# Patient Record
Sex: Female | Born: 1955 | Race: White | Hispanic: No | Marital: Married | Smoking: Former smoker
Health system: Southern US, Community
[De-identification: ages and names within clinical notes are randomized; demographics above are authoritative.]

## PROBLEM LIST (undated history)

## (undated) DIAGNOSIS — R002 Palpitations: Secondary | ICD-10-CM

## (undated) DIAGNOSIS — I471 Supraventricular tachycardia, unspecified: Secondary | ICD-10-CM

## (undated) DIAGNOSIS — M4800 Spinal stenosis, site unspecified: Secondary | ICD-10-CM

## (undated) DIAGNOSIS — I4891 Unspecified atrial fibrillation: Secondary | ICD-10-CM

## (undated) DIAGNOSIS — R42 Dizziness and giddiness: Secondary | ICD-10-CM

## (undated) DIAGNOSIS — I6529 Occlusion and stenosis of unspecified carotid artery: Secondary | ICD-10-CM

## (undated) DIAGNOSIS — T8859XA Other complications of anesthesia, initial encounter: Secondary | ICD-10-CM

## (undated) DIAGNOSIS — T8853XA Unintended awareness under general anesthesia during procedure, initial encounter: Secondary | ICD-10-CM

## (undated) DIAGNOSIS — R112 Nausea with vomiting, unspecified: Secondary | ICD-10-CM

## (undated) DIAGNOSIS — M545 Low back pain, unspecified: Secondary | ICD-10-CM

## (undated) DIAGNOSIS — M1990 Unspecified osteoarthritis, unspecified site: Secondary | ICD-10-CM

## (undated) DIAGNOSIS — F40240 Claustrophobia: Secondary | ICD-10-CM

## (undated) DIAGNOSIS — I739 Peripheral vascular disease, unspecified: Secondary | ICD-10-CM

## (undated) DIAGNOSIS — G6181 Chronic inflammatory demyelinating polyneuritis: Secondary | ICD-10-CM

## (undated) DIAGNOSIS — G629 Polyneuropathy, unspecified: Secondary | ICD-10-CM

## (undated) DIAGNOSIS — E871 Hypo-osmolality and hyponatremia: Secondary | ICD-10-CM

## (undated) DIAGNOSIS — R531 Weakness: Secondary | ICD-10-CM

## (undated) DIAGNOSIS — R519 Headache, unspecified: Secondary | ICD-10-CM

## (undated) DIAGNOSIS — M719 Bursopathy, unspecified: Secondary | ICD-10-CM

## (undated) DIAGNOSIS — K449 Diaphragmatic hernia without obstruction or gangrene: Secondary | ICD-10-CM

## (undated) DIAGNOSIS — I2510 Atherosclerotic heart disease of native coronary artery without angina pectoris: Secondary | ICD-10-CM

## (undated) DIAGNOSIS — Z9889 Other specified postprocedural states: Secondary | ICD-10-CM

## (undated) DIAGNOSIS — R200 Anesthesia of skin: Secondary | ICD-10-CM

## (undated) DIAGNOSIS — R2689 Other abnormalities of gait and mobility: Secondary | ICD-10-CM

## (undated) DIAGNOSIS — I472 Ventricular tachycardia, unspecified: Secondary | ICD-10-CM

## (undated) DIAGNOSIS — R52 Pain, unspecified: Secondary | ICD-10-CM

## (undated) DIAGNOSIS — G8929 Other chronic pain: Secondary | ICD-10-CM

## (undated) DIAGNOSIS — I4892 Unspecified atrial flutter: Secondary | ICD-10-CM

## (undated) DIAGNOSIS — I10 Essential (primary) hypertension: Secondary | ICD-10-CM

## (undated) DIAGNOSIS — H9190 Unspecified hearing loss, unspecified ear: Secondary | ICD-10-CM

## (undated) DIAGNOSIS — R413 Other amnesia: Secondary | ICD-10-CM

## (undated) DIAGNOSIS — R1310 Dysphagia, unspecified: Secondary | ICD-10-CM

## (undated) DIAGNOSIS — K219 Gastro-esophageal reflux disease without esophagitis: Secondary | ICD-10-CM

## (undated) DIAGNOSIS — R202 Paresthesia of skin: Secondary | ICD-10-CM

## (undated) DIAGNOSIS — E7800 Pure hypercholesterolemia, unspecified: Secondary | ICD-10-CM

## (undated) DIAGNOSIS — M8580 Other specified disorders of bone density and structure, unspecified site: Secondary | ICD-10-CM

## (undated) HISTORY — PX: ABDOMINAL SURGERY: SHX537

## (undated) HISTORY — PX: CHOLECYSTECTOMY: SHX55

## (undated) HISTORY — PX: TUBAL LIGATION: SHX77

## (undated) HISTORY — PX: CAROTID ENDARTERECTOMY: SUR193

## (undated) HISTORY — PX: TONSILLECTOMY: SUR1361

## (undated) HISTORY — PX: ABDOMINAL HYSTERECTOMY: SHX81

## (undated) HISTORY — DX: Claustrophobia: F40.240

## (undated) HISTORY — DX: Supraventricular tachycardia, unspecified: I47.10

## (undated) HISTORY — DX: Dizziness and giddiness: R42

## (undated) HISTORY — DX: Hypo-osmolality and hyponatremia: E87.1

## (undated) HISTORY — DX: Other complications of anesthesia, initial encounter: T88.59XA

## (undated) HISTORY — DX: Paresthesia of skin: R20.2

## (undated) HISTORY — DX: Supraventricular tachycardia: I47.1

## (undated) HISTORY — DX: Headache, unspecified: R51.9

## (undated) HISTORY — PX: EPIDURAL STEROID INJECTION: SHX510998

## (undated) HISTORY — DX: Occlusion and stenosis of unspecified carotid artery: I65.29

## (undated) HISTORY — DX: Ventricular tachycardia, unspecified: I47.20

## (undated) HISTORY — DX: Other amnesia: R41.3

## (undated) HISTORY — DX: Unintended awareness under general anesthesia during procedure, initial encounter: T88.53XA

## (undated) HISTORY — PX: HYSTERECTOMY: SHX81

## (undated) HISTORY — DX: Peripheral vascular disease, unspecified: I73.9

## (undated) HISTORY — DX: Anesthesia of skin: R20.0

## (undated) HISTORY — DX: Essential (primary) hypertension: I10

## (undated) HISTORY — DX: Pain, unspecified: R52

## (undated) HISTORY — DX: Other abnormalities of gait and mobility: R26.89

## (undated) HISTORY — DX: Unspecified atrial fibrillation: I48.91

## (undated) HISTORY — PX: CARDIAC CATHETERIZATION: SHX172

## (undated) HISTORY — PX: COLONOSCOPY, DIAGNOSTIC (SCREENING): SHX174

## (undated) HISTORY — DX: Weakness: R53.1

## (undated) HISTORY — DX: Polyneuropathy, unspecified: G62.9

## (undated) HISTORY — DX: Unspecified atrial flutter: I48.92

## (undated) HISTORY — DX: Atherosclerotic heart disease of native coronary artery without angina pectoris: I25.10

## (undated) HISTORY — DX: Unspecified hearing loss, unspecified ear: H91.90

## (undated) HISTORY — DX: Pure hypercholesterolemia, unspecified: E78.00

---

## 2016-05-16 ENCOUNTER — Ambulatory Visit (INDEPENDENT_AMBULATORY_CARE_PROVIDER_SITE_OTHER): Payer: BLUE CROSS/BLUE SHIELD | Admitting: Specialist

## 2016-05-16 ENCOUNTER — Encounter (INDEPENDENT_AMBULATORY_CARE_PROVIDER_SITE_OTHER): Payer: Self-pay | Admitting: Specialist

## 2016-05-16 DIAGNOSIS — I6521 Occlusion and stenosis of right carotid artery: Secondary | ICD-10-CM

## 2016-05-16 HISTORY — DX: Occlusion and stenosis of right carotid artery: I65.21

## 2016-05-16 NOTE — Progress Notes (Signed)
[name]    Chief Complaint   Patient presents with   . Advice Only     consult possible blockage in Right carotid artery          History of Present Illness     Audrey Gilmore is a 61 y.o. female who presents With asymptomatic right carotid artery stenosis.  Patient states that a few years back, had a neurological event and the workup was essentially unremarkable.  However, the presentation was vertigo.  Recently has undergone carotid duplex ultrasound examination which suggested significant right internal carotid artery stenosis.  An MRA was performed which suggested a greater than 90 percent stenosis of the right internal carotid artery.    Past Medical History     Past Medical History:   Diagnosis Date   . Carotid stenosis, asymptomatic, right 05/16/2016       Past Surgical History     History reviewed. No pertinent surgical history.    Family History     History reviewed. No pertinent family history.    Social History     Social History     Social History   . Marital status: Married     Spouse name: N/A   . Number of children: N/A   . Years of education: N/A     Occupational History   . Not on file.     Social History Main Topics   . Smoking status: Former Games developer   . Smokeless tobacco: Former Neurosurgeon   . Alcohol use No   . Drug use: No   . Sexual activity: Not on file     Other Topics Concern   . Not on file     Social History Narrative   . No narrative on file       Allergies     Allergies   Allergen Reactions   . Cephalosporins    . Ciprofloxacin    . Dye [Iodinated Diagnostic Agents]    . Levaquin [Levofloxacin]    . Lidocaine        Medications     No current outpatient prescriptions on file prior to visit.     No current facility-administered medications on file prior to visit.        Review of Systems     Constitutional: Negative for fevers and chills  Skin: No rash or lesions  Respiratory: Negative for cough, wheezing, or hemoptysis  Cardiovascular: as per HPI  Gastrointestinal: Negative for  abdominal pain, nausea, vomiting and diarrhea  Musculoskeletal:  No arthritic symptoms  Genitourinary: Negative for dysuria  All other systems were reviewed and are negative except what is stated in the HPI      Physical Exam     Vitals:    05/16/16 1359   BP: 134/84   Pulse: 83       Body mass index is 26.76 kg/m.    General: Patient appears their stated age, well-nourished. Alert and in no apparent distress.  Lungs: Respiratory effort unlabored, chest expansion symmetric.  Cardiac: RRR, Right carotid bruits, no JVD.   Extremities warm, Right Femoral pulses 2+, Left Femoral 2+,  Abd: Soft, nondistended, nontender. No guarding or rebound, No mid line pulsatile mass   ZOX:WRUE ROM in all 4 extremities, symmetric   Skin: Color appropriate for race, Skin warm, dry, no gangrene, no non healing ulcers, no varicose veins , no hyperpigmentation, no lipo-dermatosclerosis  Neuro: Good insight and judgment, oriented to person, place, and time CN II-XII  intact, gross motor and sensory intact    Labs     CBC: No results found for: WBC, RBC, HGB, HCT, MCV, MCHC, RDW, PLT    CMP: No results found for: NA, K, CL, CO2, GLU, BUN, CREATININE, PROT, BILITOT, ALKPHOS, AST, ALT, ANIONGAP    Lipid Panel No results found for: CHOL, TRIG, HDL    Coags: No results found for: PT, INR, PTT      Diagnostic Imaging     I have reviewed and interpreted the vascular diagnostic imaging with the patient    Assessment and Plan       1. Carotid stenosis, asymptomatic, right         My impression is that she has greater than 90 percent asymptomatic right internal carotid artery, which was documented with a duplex ultrasound as well as a MRA.  The end-diastolic velocities were greater than 100 cm/s.  The MRA was reviewed in detail, which suggests 90-99 percent stenosis.  Therefore, because of her relatively young age, I have recommended to her to undergo right carotid artery endarterectomy.  She was also given the option of possible conservative  therapy with maximal medical therapy which she declined.  Therefore, we will proceed with right carotid artery endarterectomy under local anesthesia.    The risks and benefits of the procedures were explained to the patient and agreed. The risk of infection, bleeding, nerve injury, TIA, stroke, thrombosis and other complications were explained in detail and agreed.     Dr. Karsten Ro would like to thank you for allowing me to participate in care of this patient.    With regards    [name], MD, FACS, RPVI  [facility]  Chairman, Dept of Surgery, [facility]

## 2016-05-18 ENCOUNTER — Encounter (INDEPENDENT_AMBULATORY_CARE_PROVIDER_SITE_OTHER): Payer: Self-pay | Admitting: Specialist

## 2016-05-30 ENCOUNTER — Ambulatory Visit: Payer: BLUE CROSS/BLUE SHIELD | Attending: Specialist

## 2016-05-30 NOTE — Pre-Procedure Instructions (Signed)
Faxed cardio for LOV/EKG  Faxed PCP for LOV/labs

## 2016-05-31 ENCOUNTER — Encounter (INDEPENDENT_AMBULATORY_CARE_PROVIDER_SITE_OTHER): Payer: Self-pay

## 2016-06-05 NOTE — Pre-Procedure Instructions (Signed)
Refaxed PCP for LOV/recent labs

## 2016-06-06 ENCOUNTER — Telehealth (INDEPENDENT_AMBULATORY_CARE_PROVIDER_SITE_OTHER): Payer: Self-pay | Admitting: Family

## 2016-06-06 NOTE — Telephone Encounter (Signed)
Patient is pending a right carotid endarterectomy with Dr. Lucianne Muss, on January 29.    The procedure is scheduled for Audrey Gilmore at 7 AM    Medications: The patient will continue aspirin and tramadol    Patient was educated on postoperative instructions.

## 2016-06-07 NOTE — Pre-Procedure Instructions (Signed)
[facility] left VM stating they were unable to read the fax that was received requesting something.  Office called back at [phone]. It appears based on prev Note in Epic that the request is for labs and LOV. This information was requested verbally to the caller.

## 2016-06-08 NOTE — Anesthesia Preprocedure Evaluation (Addendum)
Anesthesia Evaluation    AIRWAY    Mallampati: II    TM distance: >3 FB  Neck ROM: full  Mouth Opening:full   CARDIOVASCULAR    cardiovascular exam normal       DENTAL    no notable dental hx     PULMONARY    pulmonary exam normal     OTHER FINDINGS    Pt id and h&P reviewed.          Relevant Problems   No active problems are marked relevant to this note.       PSS Anesthesia Comments:   PONV, PSVT,HH,GERD, Chronic Pain, Dysphagia; Labs(03/22/2016)- WNL; EKG- SR; Echo(04/2016)- EF 65 %, Trace MR;Carotid Doppler(04/2016)- Rt.ICA, proximal- 80-99 % occlusion, Left ICA- < 50 % occlusion; Cardiac LOV(05/2016)- Dr. Karsten Ro.LSullivan          Anesthesia Plan    ASA 3     peripheral nerve block               (Anesthesia plan explained. Risks and benefits explained.Patient agrees with plan.)      intravenous induction   Detailed anesthesia plan: general IV and PNB  Monitors/Adjuncts: other    Post Op: other  Post op pain management: per surgeon    informed consent obtained    Plan discussed with CRNA.               Signed by: Helaine Chess 06/08/16 3:00 PM

## 2016-06-11 ENCOUNTER — Inpatient Hospital Stay
Admission: RE | Admit: 2016-06-11 | Discharge: 2016-06-12 | DRG: 039 | Disposition: A | Discharge status: Home / Self Care | Payer: BLUE CROSS/BLUE SHIELD | Source: Ambulatory Visit | Attending: Specialist | Admitting: Specialist

## 2016-06-11 ENCOUNTER — Inpatient Hospital Stay: Payer: BLUE CROSS/BLUE SHIELD | Admitting: Specialist

## 2016-06-11 ENCOUNTER — Inpatient Hospital Stay: Payer: BLUE CROSS/BLUE SHIELD | Admitting: Anesthesiology

## 2016-06-11 ENCOUNTER — Encounter
Admission: RE | Disposition: A | Discharge status: Home / Self Care | Payer: Self-pay | Source: Ambulatory Visit | Attending: Specialist

## 2016-06-11 DIAGNOSIS — Z87891 Personal history of nicotine dependence: Secondary | ICD-10-CM

## 2016-06-11 DIAGNOSIS — K219 Gastro-esophageal reflux disease without esophagitis: Secondary | ICD-10-CM | POA: Diagnosis present

## 2016-06-11 DIAGNOSIS — I6521 Occlusion and stenosis of right carotid artery: Principal | ICD-10-CM | POA: Diagnosis present

## 2016-06-11 DIAGNOSIS — Z7982 Long term (current) use of aspirin: Secondary | ICD-10-CM

## 2016-06-11 HISTORY — DX: Other specified disorders of bone density and structure, unspecified site: M85.80

## 2016-06-11 HISTORY — DX: Unspecified osteoarthritis, unspecified site: M19.90

## 2016-06-11 HISTORY — DX: Gastro-esophageal reflux disease without esophagitis: K21.9

## 2016-06-11 HISTORY — DX: Low back pain, unspecified: M54.50

## 2016-06-11 HISTORY — DX: Nausea with vomiting, unspecified: R11.2

## 2016-06-11 HISTORY — PX: ENDARTERECTOMY, CAROTID ARTERY: SHX3831

## 2016-06-11 HISTORY — DX: Diaphragmatic hernia without obstruction or gangrene: K44.9

## 2016-06-11 HISTORY — DX: Other specified postprocedural states: Z98.890

## 2016-06-11 HISTORY — DX: Other chronic pain: G89.29

## 2016-06-11 HISTORY — DX: Supraventricular tachycardia, unspecified: I47.10

## 2016-06-11 HISTORY — DX: Dysphagia, unspecified: R13.10

## 2016-06-11 HISTORY — DX: Bursopathy, unspecified: M71.9

## 2016-06-11 HISTORY — DX: Palpitations: R00.2

## 2016-06-11 LAB — TYPE AND SCREEN
AB Screen Gel: NEGATIVE
ABO Rh: O POS

## 2016-06-11 SURGERY — ENDARTERECTOMY, CAROTID ARTERY
Anesthesia: Regional | Site: Neck | Laterality: Right | Wound class: Clean

## 2016-06-11 MED ORDER — LORAZEPAM 1 MG PO TABS
1.0000 mg | ORAL_TABLET | ORAL | Status: DC
Start: 2016-06-12 — End: 2016-06-12

## 2016-06-11 MED ORDER — LIDOCAINE HCL 1 % IJ SOLN
INTRAMUSCULAR | Status: DC | PRN
Start: 2016-06-11 — End: 2016-06-11
  Administered 2016-06-11: 5 mL

## 2016-06-11 MED ORDER — LORAZEPAM 0.5 MG PO TABS
0.5000 mg | ORAL_TABLET | Freq: Two times a day (BID) | ORAL | Status: DC | PRN
Start: 2016-06-11 — End: 2016-06-11
  Filled 2016-06-11: qty 1

## 2016-06-11 MED ORDER — HYDROMORPHONE HCL 1 MG/ML IJ SOLN
0.5000 mg | INTRAMUSCULAR | Status: DC | PRN
Start: 2016-06-11 — End: 2016-06-11

## 2016-06-11 MED ORDER — ACETAMINOPHEN 325 MG PO TABS
650.0000 mg | ORAL_TABLET | ORAL | Status: DC | PRN
Start: 2016-06-11 — End: 2016-06-12
  Administered 2016-06-11 – 2016-06-12 (×4): 650 mg via ORAL
  Filled 2016-06-11 (×5): qty 2

## 2016-06-11 MED ORDER — CEFAZOLIN SODIUM 1 G IJ SOLR
INTRAMUSCULAR | Status: DC | PRN
Start: 2016-06-11 — End: 2016-06-11
  Administered 2016-06-11: 2 g via INTRAVENOUS

## 2016-06-11 MED ORDER — PROTAMINE SULFATE 10 MG/ML IV SOLN
INTRAVENOUS | Status: DC | PRN
Start: 2016-06-11 — End: 2016-06-11
  Administered 2016-06-11 (×2): 10 mg via INTRAVENOUS

## 2016-06-11 MED ORDER — ONDANSETRON HCL 4 MG/2ML IJ SOLN
INTRAMUSCULAR | Status: AC
Start: 2016-06-11 — End: ?
  Filled 2016-06-11: qty 2

## 2016-06-11 MED ORDER — ONDANSETRON HCL 4 MG/2ML IJ SOLN
4.0000 mg | Freq: Once | INTRAMUSCULAR | Status: AC | PRN
Start: 2016-06-11 — End: 2016-06-11

## 2016-06-11 MED ORDER — FENTANYL CITRATE (PF) 50 MCG/ML IJ SOLN (WRAP)
INTRAMUSCULAR | Status: AC
Start: 2016-06-11 — End: 2016-06-11
  Administered 2016-06-11: 25 ug via INTRAVENOUS
  Filled 2016-06-11: qty 2

## 2016-06-11 MED ORDER — HEPARIN SODIUM (PORCINE) 5000 UNIT/ML IJ SOLN
INTRAMUSCULAR | Status: AC
Start: 2016-06-11 — End: ?
  Filled 2016-06-11: qty 1

## 2016-06-11 MED ORDER — BUPIVACAINE HCL (PF) 0.5 % IJ SOLN
INTRAMUSCULAR | Status: DC | PRN
Start: 2016-06-11 — End: 2016-06-11
  Administered 2016-06-11 (×2): 5 mL via EPIDURAL

## 2016-06-11 MED ORDER — LIDOCAINE-EPINEPHRINE 2 %-1:200000 IJ SOLN
INTRAMUSCULAR | Status: DC | PRN
Start: 2016-06-11 — End: 2016-06-11
  Administered 2016-06-11 (×2): 5 mL

## 2016-06-11 MED ORDER — PROPOFOL 10 MG/ML IV EMUL (WRAP)
INTRAVENOUS | Status: AC
Start: 2016-06-11 — End: ?
  Filled 2016-06-11: qty 20

## 2016-06-11 MED ORDER — LIDOCAINE HCL (PF) 1 % IJ SOLN
INTRAMUSCULAR | Status: AC
Start: 2016-06-11 — End: ?
  Filled 2016-06-11: qty 30

## 2016-06-11 MED ORDER — LORAZEPAM 1 MG PO TABS
1.0000 mg | ORAL_TABLET | Freq: Once | ORAL | Status: AC
Start: 2016-06-11 — End: 2016-06-11
  Administered 2016-06-11: 1 mg via ORAL

## 2016-06-11 MED ORDER — LORAZEPAM 1 MG PO TABS
1.0000 mg | ORAL_TABLET | Freq: Two times a day (BID) | ORAL | Status: DC | PRN
Start: 2016-06-11 — End: 2016-06-11
  Administered 2016-06-11: 1 mg via ORAL
  Filled 2016-06-11: qty 1

## 2016-06-11 MED ORDER — ONDANSETRON HCL 4 MG/2ML IJ SOLN
INTRAMUSCULAR | Status: DC | PRN
Start: 2016-06-11 — End: 2016-06-11
  Administered 2016-06-11: 4 mg via INTRAVENOUS

## 2016-06-11 MED ORDER — HEPARIN SODIUM (PORCINE) 1000 UNIT/ML IJ SOLN
INTRAMUSCULAR | Status: AC
Start: 2016-06-11 — End: ?
  Filled 2016-06-11: qty 10

## 2016-06-11 MED ORDER — MEPERIDINE HCL 25 MG/ML IJ SOLN
12.5000 mg | INTRAMUSCULAR | Status: DC | PRN
Start: 2016-06-11 — End: 2016-06-11

## 2016-06-11 MED ORDER — FENTANYL CITRATE (PF) 50 MCG/ML IJ SOLN (WRAP)
25.0000 ug | INTRAMUSCULAR | Status: DC | PRN
Start: 2016-06-11 — End: 2016-06-11

## 2016-06-11 MED ORDER — TRAMADOL HCL 50 MG PO TABS
50.0000 mg | ORAL_TABLET | Freq: Four times a day (QID) | ORAL | Status: DC | PRN
Start: 2016-06-11 — End: 2016-06-11
  Administered 2016-06-11: 50 mg via ORAL
  Filled 2016-06-11: qty 1

## 2016-06-11 MED ORDER — MIDAZOLAM HCL 2 MG/2ML IJ SOLN
INTRAMUSCULAR | Status: AC
Start: 2016-06-11 — End: ?
  Filled 2016-06-11: qty 2

## 2016-06-11 MED ORDER — THROMBIN 5000 UNITS EX SOLR
CUTANEOUS | Status: AC
Start: 2016-06-11 — End: ?
  Filled 2016-06-11: qty 5000

## 2016-06-11 MED ORDER — THROMBIN 5000 UNITS EX SOLR
CUTANEOUS | Status: DC | PRN
Start: 2016-06-11 — End: 2016-06-11
  Administered 2016-06-11: 10000 [IU] via TOPICAL

## 2016-06-11 MED ORDER — HEPARIN SODIUM (PORCINE) 1000 UNIT/ML IJ SOLN
INTRAMUSCULAR | Status: AC
Start: 2016-06-11 — End: ?
  Filled 2016-06-11: qty 1

## 2016-06-11 MED ORDER — DEXAMETHASONE SODIUM PHOSPHATE 4 MG/ML IJ SOLN (WRAP)
INTRAMUSCULAR | Status: DC | PRN
Start: 2016-06-11 — End: 2016-06-11
  Administered 2016-06-11: 8 mg via INTRAVENOUS

## 2016-06-11 MED ORDER — HYDROCODONE-ACETAMINOPHEN 5-325 MG PO TABS
1.0000 | ORAL_TABLET | Freq: Once | ORAL | Status: DC | PRN
Start: 2016-06-11 — End: 2016-06-11

## 2016-06-11 MED ORDER — PROTAMINE SULFATE 10 MG/ML IV SOLN
INTRAVENOUS | Status: AC
Start: 2016-06-11 — End: ?
  Filled 2016-06-11: qty 5

## 2016-06-11 MED ORDER — LORAZEPAM 0.5 MG PO TABS
0.5000 mg | ORAL_TABLET | Freq: Two times a day (BID) | ORAL | Status: DC | PRN
Start: 2016-06-11 — End: 2016-06-11

## 2016-06-11 MED ORDER — ESMOLOL HCL 100 MG/10ML IV SOLN
INTRAVENOUS | Status: AC
Start: 2016-06-11 — End: ?
  Filled 2016-06-11: qty 10

## 2016-06-11 MED ORDER — CEFAZOLIN SODIUM 1 G IJ SOLR
INTRAMUSCULAR | Status: AC
Start: 2016-06-11 — End: ?
  Filled 2016-06-11: qty 2000

## 2016-06-11 MED ORDER — TRAMADOL HCL 50 MG PO TABS
100.0000 mg | ORAL_TABLET | Freq: Two times a day (BID) | ORAL | Status: DC | PRN
Start: 2016-06-11 — End: 2016-06-12

## 2016-06-11 MED ORDER — PROPOFOL INFUSION 10 MG/ML
INTRAVENOUS | Status: DC | PRN
Start: 2016-06-11 — End: 2016-06-11
  Administered 2016-06-11: 50 ug/kg/min via INTRAVENOUS

## 2016-06-11 MED ORDER — TRAMADOL HCL 50 MG PO TABS
50.0000 mg | ORAL_TABLET | Freq: Once | ORAL | Status: AC
Start: 2016-06-11 — End: 2016-06-11
  Administered 2016-06-11: 50 mg via ORAL
  Filled 2016-06-11: qty 1

## 2016-06-11 MED ORDER — SODIUM CHLORIDE 0.9 % IV SOLN
INTRAVENOUS | Status: DC
Start: 2016-06-11 — End: 2016-06-12

## 2016-06-11 MED ORDER — MIDAZOLAM HCL 2 MG/2ML IJ SOLN
INTRAMUSCULAR | Status: DC | PRN
Start: 2016-06-11 — End: 2016-06-11
  Administered 2016-06-11 (×2): .5 mg via INTRAVENOUS
  Administered 2016-06-11: 1 mg via INTRAVENOUS
  Administered 2016-06-11 (×2): 2 mg via INTRAVENOUS

## 2016-06-11 MED ORDER — ASPIRIN 81 MG PO TBEC
81.0000 mg | DELAYED_RELEASE_TABLET | ORAL | Status: DC
Start: 2016-06-11 — End: 2016-06-12
  Administered 2016-06-11: 81 mg via ORAL
  Filled 2016-06-11: qty 1

## 2016-06-11 MED ORDER — FENTANYL CITRATE (PF) 50 MCG/ML IJ SOLN (WRAP)
INTRAMUSCULAR | Status: DC | PRN
Start: 2016-06-11 — End: 2016-06-11
  Administered 2016-06-11 (×2): 25 ug via INTRAVENOUS
  Administered 2016-06-11: 100 ug via INTRAVENOUS
  Administered 2016-06-11 (×2): 25 ug via INTRAVENOUS

## 2016-06-11 MED ORDER — GELATIN ABSORBABLE 12-7 MM EX MISC
CUTANEOUS | Status: DC | PRN
Start: 2016-06-11 — End: 2016-06-11
  Administered 2016-06-11: 2 via TOPICAL

## 2016-06-11 MED ORDER — DEXAMETHASONE SODIUM PHOSPHATE 4 MG/ML IJ SOLN
INTRAMUSCULAR | Status: AC
Start: 2016-06-11 — End: ?
  Filled 2016-06-11: qty 2

## 2016-06-11 MED ORDER — SODIUM CHLORIDE 0.9 % IR SOLN
Status: DC | PRN
Start: 2016-06-11 — End: 2016-06-11
  Administered 2016-06-11: 1000 mL

## 2016-06-11 MED ORDER — HYDRALAZINE HCL 20 MG/ML IJ SOLN
5.0000 mg | INTRAMUSCULAR | Status: DC | PRN
Start: 2016-06-11 — End: 2016-06-11

## 2016-06-11 MED ORDER — SODIUM CHLORIDE 0.9 % IV SOLN
INTRAVENOUS | Status: DC | PRN
Start: 2016-06-11 — End: 2016-06-11

## 2016-06-11 MED ORDER — ONDANSETRON 4 MG PO TBDP
4.0000 mg | ORAL_TABLET | Freq: Three times a day (TID) | ORAL | Status: DC | PRN
Start: 2016-06-11 — End: 2016-06-12
  Administered 2016-06-11: 4 mg via ORAL
  Filled 2016-06-11: qty 1

## 2016-06-11 MED ORDER — OXYCODONE-ACETAMINOPHEN 5-325 MG PO TABS
1.0000 | ORAL_TABLET | ORAL | Status: DC | PRN
Start: 2016-06-11 — End: 2016-06-12

## 2016-06-11 MED ORDER — LORAZEPAM 1 MG PO TABS
1.0000 mg | ORAL_TABLET | ORAL | Status: DC
Start: 2016-06-11 — End: 2016-06-11
  Filled 2016-06-11: qty 1

## 2016-06-11 MED ORDER — LORAZEPAM 0.5 MG PO TABS
0.5000 mg | ORAL_TABLET | Freq: Every evening | ORAL | Status: DC
Start: 2016-06-11 — End: 2016-06-11

## 2016-06-11 MED ORDER — PROMETHAZINE HCL 25 MG/ML IJ SOLN
6.2500 mg | Freq: Once | INTRAMUSCULAR | Status: DC | PRN
Start: 2016-06-11 — End: 2016-06-11

## 2016-06-11 MED ORDER — BUPIVACAINE HCL (PF) 0.25 % IJ SOLN
INTRAMUSCULAR | Status: AC
Start: 2016-06-11 — End: ?
  Filled 2016-06-11: qty 30

## 2016-06-11 MED ORDER — FENTANYL CITRATE (PF) 50 MCG/ML IJ SOLN (WRAP)
INTRAMUSCULAR | Status: AC
Start: 2016-06-11 — End: ?
  Filled 2016-06-11: qty 2

## 2016-06-11 MED ORDER — ASPIRIN 81 MG PO TBEC
81.0000 mg | DELAYED_RELEASE_TABLET | Freq: Every day | ORAL | Status: DC
Start: 2016-06-11 — End: 2016-06-11
  Filled 2016-06-11: qty 1

## 2016-06-11 MED ORDER — HYDROMORPHONE HCL 0.5 MG/0.5 ML IJ SOLN
INTRAMUSCULAR | Status: AC
Start: 2016-06-11 — End: 2016-06-11
  Administered 2016-06-11: 0.5 mg via INTRAVENOUS
  Filled 2016-06-11: qty 0.5

## 2016-06-11 MED ORDER — SUCCINYLCHOLINE CHLORIDE 20 MG/ML IJ SOLN
INTRAMUSCULAR | Status: AC
Start: 2016-06-11 — End: ?
  Filled 2016-06-11: qty 10

## 2016-06-11 MED ORDER — LACTATED RINGERS IV SOLN
INTRAVENOUS | Status: DC
Start: 2016-06-11 — End: 2016-06-12

## 2016-06-11 MED ORDER — ESMOLOL HCL 100 MG/10ML IV SOLN
INTRAVENOUS | Status: DC | PRN
Start: 2016-06-11 — End: 2016-06-11
  Administered 2016-06-11: 10 mg via INTRAVENOUS

## 2016-06-11 MED ORDER — LORAZEPAM 0.5 MG PO TABS
0.5000 mg | ORAL_TABLET | Freq: Every evening | ORAL | Status: DC | PRN
Start: 2016-06-11 — End: 2016-06-12
  Administered 2016-06-11: 0.5 mg via ORAL
  Filled 2016-06-11: qty 1

## 2016-06-11 MED ORDER — ONDANSETRON HCL 4 MG/2ML IJ SOLN
INTRAMUSCULAR | Status: AC
Start: 2016-06-11 — End: 2016-06-11
  Administered 2016-06-11: 4 mg via INTRAVENOUS
  Filled 2016-06-11: qty 2

## 2016-06-11 SURGICAL SUPPLY — 55 items
CANISTER SUCTION 1500 CC LINE (Suction) ×1
CANISTER SUCTION 1500 CC SEMIRIGID 1 (Suction) ×1
CANISTER SUCTION 1500 CC SEMIRIGID 1 ELBOW FILTER SELF ALIGN LID (Suction) ×1 IMPLANT
CATH DOVER URETHRAL 16F 16IN (Catheter Urine) ×1
CATH FOGARTY EMBO 2FX60CM TUBE (Catheter) ×2 IMPLANT
CATHETER URETHRAL OD16 FR 2 STAGGER DRAINAGE EYE ROUND CLOSED TIP (Catheter Urine) ×1 IMPLANT
CATHETER URTH PVC DOV RBNL 16FR 16IN LF (Catheter Urine) ×1
CHLORAPREP W/TINT+APLICTR 10.5 (Prep) ×2 IMPLANT
CLIP INTERNAL L3.2 MM MEDIUM LIGATE OPEN (Clips) ×1
CLIP INTERNAL L3.2 MM MEDIUM LIGATE OPEN LIGACLIPÂ® TITANIUM SILVER (Clips) ×1 IMPLANT
CLOSURE STERI-STRIP 1X5IN (Dressing) ×2 IMPLANT
COVER PROBE SURGI L96 IN X W6-3 IN (Drape) ×1
COVER PROBE SURGI L96 IN X W6-3 IN ACCORDION FOLD INTRAOPERATIVE (Drape) ×1 IMPLANT
COVER ULTRASND STRLE 6"X3X96 (Drape) ×1
DISSECTOR KITTNER (Sponge) ×2 IMPLANT
DRAPE DEVON MAG INST 16X20IN (Drape) ×1
DRAPE INCISE IOBAN-2 60X35CM (Drape) ×2 IMPLANT
DRAPE MAGNETIC HAND FREE TRANSFER (Drape) ×1 IMPLANT
GLOVE SURG LTX SZ 8 STRL (Glove) ×2 IMPLANT
HANDLE LIGHT ADAPTIVE LIGHT CONTROL PLUS (Other) ×1
HANDLE LIGHT ADAPTIVE LIGHT CONTROL PLUS TECHNOLOGY SNAP ON LENS TOUCH (Other) ×1 IMPLANT
HANDLE TRUMPF LIGHT  DISP (Other) ×1
KIT INFECTION CONTROL CUSTOM (Kits) ×2
KIT INFECTION CONTROL CUSTOM IFOH03 (Kits) ×1 IMPLANT
LIGACLIP MED (Clips) ×1
PACK AV ~~LOC~~ (Pack) ×2 IMPLANT
PAD ELECTROSRG GRND REM W CRD (Procedure Accessories) ×2 IMPLANT
PATCH VASCULAR L8 CM X W.8 CM THK.45 MM TAPER BIOLOGIC XENOSURE BOVINE (Patch) ×1 IMPLANT
PATCH XENOSURE 0.8X8CM (Patch) ×2 IMPLANT
SET BLOOD COLLECTION OD23 GA L12 IN LOK (Needles) ×1
SET BLOOD COLLECTION OD23 GA L12 IN LOK TUBE NO LUER ADAPTER BUTTERFLY (Needles) ×1 IMPLANT
SET SFTY COLLCT BLD 23GX.75IN (Needles) ×1
SHUNT CAROTID OD4 MM ODSEC5 MM SUNDT SILICONE L30 CM CAROTID ARTERY (Shunt) IMPLANT
SHUNT NEUROSURGICAL L30 CM EXTERNAL LOOP (Shunt)
SHUNT SUNDT EXT CAROTID 3X4MM (Shunt) IMPLANT
SHUNT SUNDT EXT CAROTID 4X5MM (Shunt)
SOL NACL .9% 500ML LATEX (IV Solutions) ×1
SOLUTION IV 0.9% SODIUM CHLORIDE 500 ML (IV Solutions) ×1
SOLUTION IV 0.9% SODIUM CHLORIDE 500 ML PLASTIC CONTAINER (IV Solutions) ×1 IMPLANT
STOPCOCK 4-WAY LARGE BORE (IV Supply) ×1
STOPCOCK IV 4 WAY LARGE BORE ROTATE MALE (IV Supply) ×1
STOPCOCK IV 4 WAY LARGE BORE ROTATE MALE LUER LOCK ADAPTER LIPID (IV Supply) ×1 IMPLANT
SUTURE PROLENE 5-0 BV-1 (Suture) ×1
SUTURE PROLENE 6-0 BV1 30IN (Suture) IMPLANT
SUTURE PROLENE WITH HEMOSEAL BLUE 5-0 (Suture) ×1
SUTURE PROLENE WITH HEMOSEAL BLUE 5-0 BV-1 L24 IN 2 ARM MONOFILAMENT (Suture) ×1 IMPLANT
SYRINGE 1CC LL DISP (Syringes, Needles) IMPLANT
TAPE UMBILICAL RADPQ .125X18IN (Suture) ×2 IMPLANT
TOWEL L26 IN X W17 IN COTTON PREWASH DELINT BLUE ACTISORB DELUXE (Procedure Accessories) ×1 IMPLANT
TOWEL OR STRL DLUX BLUE 10PK (Procedure Accessories) ×1
TOWEL SRG CTTN 26X17IN LF STRL PREWASH (Procedure Accessories) ×1
TUBING CONNECTING STERILE 10FT (Tubing) ×1
TUBING PRESSURE 48IN (Tubing) ×2 IMPLANT
TUBING SUCTION ID3/16 IN L10 FT (Tubing) ×1
TUBING SUCTION ID3/16 IN L10 FT NONCONDUCTIVE STRAIGHT MALE FEMALE (Tubing) ×1 IMPLANT

## 2016-06-11 NOTE — Op Note (Signed)
FULL OPERATIVE NOTE    Date Time: 06/11/16 9:06 AM  Patient Name: Audrey Gilmore  Attending Physician: Fransico Setters, MD      Date of Operation:   06/11/2016    Providers Performing:   Surgeon(s):  Fransico Setters, MD    Circulator: Rufina Falco, RN  Scrub Person: Cathlean Marseilles  First Assistant: Willette Brace  Preceptor: Teodoro Kil, RN    Operative Procedure:   Procedure(s):  ENDARTERECTOMY, CAROTID ARTERY WITH PATCH ANGIOPLASTY    Preoperative Diagnosis:   Pre-Op Diagnosis Codes:     * Carotid stenosis, right [I65.21]    Postoperative Diagnosis:   Post-Op Diagnosis Codes:     * Carotid stenosis, right [I65.21]    Indications:   The patient was found to have a greater than 90 percent stenosis of the right internal carotid artery.  This has significantly worsened since the previous studies.  A few years ago.  Therefore, decision was made to proceed with right carotid artery endarterectomy.  The risks and benefits of the procedures were explained to the patient and agreed. The risk of infection, bleeding, nerve injury, TIA, stroke, thrombosis and other complications were explained in detail and agreed.       Operative Notes:   Patient was brought to the operating suite, was placed in supine position.  IV antibiotics were administered by the anesthesia staff.  Cervical anesthesia was administered by the anesthesia staff.  Intraoperative ultrasound was performed on 11 of bifurcation was marked. The Right neck was prepped and draped in sterile fashion.  We waited for 3 minutes prior to draping the neck to reduce potential risk of fire.  Then, using knife, a 5 cm curved linear incision was made along the crease.  In most cases was achieved using electrocautery and platysma was divided using electrocautery.  Then we dissected out the facial vein was ligated using 2-0 Vicryl and hemoclips.  They are at this time carotid sheath was entered and 100 units of heparin per kilogram was  administered intravenously by the anesthesia staff.  Patient was neurologically completely intact.  Then we proceeded with carefully dissecting out the common carotid artery, internal carotid and external carotid arteries.  The above vessels were encircled with a vessel loop and an umbilical tape with a Rummel tourniquet.  After 3 minutes of circulation time for the heparin, we proceeded with clamping the external carotid artery, then arterial pressure was obtained from the distal common carotid artery, which somewhat correlated with the cuff pressure.  Then common carotid artery was clamped and stump pressure was obtained.  Patient was neurologically  intact.  Therefore, no shunt was used.  Internal carotid artery was clamped using aneurysm clip.  Arteriotomy was made using 11 blade and using Potts scissors.  The arteriotomy was extended into the internal carotid artery.  Severe ulcerated plaque and significant stenosis were encountered.  Endarterectomy carried out in an en bloc fashion.  The distal intima tapered smoothly.  No tacking sutures were used.  Patient was a neurologically completely intact.  All the arteries were flushed out.  The area was irrigated copiously with heparinized saline.  8 mm pericardial patch was used with 5-0 Prolene in a running fashion.  A patch angioplasty was performed.  Prior to completing the patch angioplasty, all the arteries were flushed out.  Flow was restored initially into the external carotid artery, then into the internal carotid artery.  Then, intraoperative ultrasound was performed.  Images were documented and placed  on the chart which showed widely patent common carotid and internal carotid arteries.  There was no gross evidence of intimal flaps or defects.  20 mg of protamine was administered.  After a few minutes there was no evidence of bleeding.  No drain was used.  The platysma was closed using 3-0 Monocryl in running fashion and skin was closed using 4-0 Monocryl  in subcuticular fashion.  Steri-Strips were applied.  Patient tolerated the procedure well and was transferred to the recovery room neurologically completely intact.    Estimated Blood Loss:   250 mL    Implants:     Implant Name Type Inv. Item Serial No. Manufacturer Lot No. LRB No. Used Action   PATCH XENOSURE 0.8X8CM - [id number] Patch PATCH XENOSURE 0.8X8CM  [employer] [id number] Right 1 Implanted       Drains:       Specimens:       Complications:       Signed by: Fransico Setters, MD

## 2016-06-11 NOTE — Anesthesia Procedure Notes (Signed)
Peripheral    Patient location during procedure: OR  Reason for block: Primary anesthetic in the OR  Injection technique: Single-shot  Block Region: Superficial cervical plexus  Laterality: Right  Block at surgeon's request Yes  Start time: 06/11/2016 7:33 AM  End time: 06/11/2016 7:37 AM    Staffing  Resident/CRNA: Georga Kaufmann E  Performed: Resident/CRNA     Pre-procedure Checklist   Completed: patient identified, surgical consent, pre-op evaluation, timeout performed, risks and benefits discussed, anesthesia consent given and correct site  Timeout Completed:  06/11/2016 7:30 AM    Peripheral Block  Patient monitoring: Pulse oximetry, EKG, NIBP and Nasal cannula O2  Patient position: Supine  Premedication: Yes and Meaningful contact maintained  Local infiltration: Lidocaine 1%    Needle  Needle gauge: 25 G  Needle length: 1 in    Ultrasound Guided: Relevant anatomy identified (nerve, vessels, muscle)      Assessment   Incremental injection: yes  Injection made incrementally with aspirations every 1 mL.  Injection Resistance: no  Paresthesia Pain: No    Blood Aspirated: No  no suspected intravascular injection  Patient tolerated procedure well: Yes  Block Outcome: No complications and Successful block

## 2016-06-11 NOTE — Plan of Care (Signed)
Problem: Safety  Goal: Patient will be free from injury during hospitalization  Outcome: Progressing   06/11/16 1126   Goal/Interventions addressed this shift   Patient will be free from injury during hospitalization  Assess patient's risk for falls and implement fall prevention plan of care per policy;Provide and maintain safe environment;Use appropriate transfer methods;Ensure appropriate safety devices are available at the bedside;Include patient/ family/ care giver in decisions related to safety;Hourly rounding;Assess for patients risk for elopement and implement Elopement Risk Plan per policy       Problem: Pain  Goal: Pain at adequate level as identified by patient  Outcome: Progressing   06/11/16 1126   Goal/Interventions addressed this shift   Pain at adequate level as identified by patient Identify patient comfort function goal;Assess for risk of opioid induced respiratory depression, including snoring/sleep apnea. Alert healthcare team of risk factors identified.;Assess pain on admission, during daily assessment and/or before any "as needed" intervention(s);Reassess pain within 30-60 minutes of any procedure/intervention, per Pain Assessment, Intervention, Reassessment (AIR) Cycle;Evaluate if patient comfort function goal is met;Evaluate patient's satisfaction with pain management progress;Offer non-pharmacological pain management interventions;Include patient/patient care companion in decisions related to pain management as needed   Will continue to monitor

## 2016-06-11 NOTE — Consults (Signed)
[facility]  MEDICINE CONSULT NOTE      Patient: Audrey Gilmore  Date: 06/11/2016   DOB: 06-Feb-1956  Admission Date: 06/11/2016   MRN: [id number]  Attending: Fransico Setters, MD       Reason for Consult: Medical management  Requesting Physician: Audrey Setters, MD  Consulting Physician: Audrey Apo MD  History Gathered From: Self,chart review    CHIEF COMPLAINT :  RIGHT NECK PAIN     HISTORY AND PHYSICAL     Audrey Gilmore is a 61 y.o. female who is currently being managed post-operatively after undergoing a Right Carotid artery end-arterectomy.Consult was requested for medical management.    Past Medical History:   Diagnosis Date   . Arthritis    . Bursitis    . Carotid stenosis, asymptomatic, right 05/16/2016   . Chronic pain     Follows w/Pain Management for many years   . Dysphagia    . Gastroesophageal reflux disease    . Hiatal hernia    . Low back pain    . Osteopenia    . Palpitations     Due to dehydration/increased caffeine intake   . Paroxysmal SVT (supraventricular tachycardia)     Last episode ~1992   . Post-operative nausea and vomiting        Past Surgical History:   Procedure Laterality Date   . CHOLECYSTECTOMY     . COLONOSCOPY     . HYSTERECTOMY     . TONSILLECTOMY     . TUBAL LIGATION         Prior to Admission medications    Medication Sig Start Date End Date Taking? Authorizing Provider   aspirin EC 81 MG EC tablet Take 81 mg by mouth daily.   Yes [provider]   Cholecalciferol (VITAMIN D3) 50000 units Cap TAKE ONE CAPSULE BY MOUTH WEEKLY 04/26/16  Yes [provider]   Lactobacillus Rhamnosus, GG, (CULTURELLE PO) Take 1 capsule by mouth daily.   Yes [provider]   LORazepam (ATIVAN) 0.5 MG tablet TAKE 1 TABLET BY MOUTH EVERY 6 HOURS    Takes 1mg   BID     Takes between 1500-1700 and 1800-1900 04/26/16  Yes [provider]   Multiple Vitamins-Minerals (EMERGEN-C IMMUNE PO) Take by mouth daily as needed.   Yes [provider]   traMADol (ULTRAM) 50 MG tablet TAKE 2 TABLETS BY MOUTH EVERY 8 HOURS   Takes BID 03/29/16  Yes [provider]       Allergies   Allergen Reactions   . Bactrim [Sulfamethoxazole-Trimethoprim] Hives and Itching   . Cephalosporins Hives and Itching   . Ciprofloxacin Other (See Comments)     Severe joint pain   . Dye [Iodinated Diagnostic Agents] Itching and Other (See Comments)     Sneezing, itching   . Levaquin [Levofloxacin] Other (See Comments)     Severe joint pain   . Lipitor [Atorvastatin] Other (See Comments)     Muscle ache/spasms/cramps  Headache   . Lidocaine Palpitations       CODE STATUS: full code    PRIMARY CARE MD: Audrey Bottcher, MD    FAMILY HISTORY : History reviewed. No pertinent family history.    Social History   Substance Use Topics   . Smoking status: Former Smoker     Years: 8.00     Quit date: 05/31/1983   . Smokeless tobacco: Never Used   . Alcohol use Yes  Comment: < weekly basis       REVIEW OF SYSTEMS     Positive ROS:   General ROS: fatigue     Negative ROS:   General ROS: fever and night sweats   Ophthalmic ROS: blurry vision and decreased vision   Hematological and Lymphatic ROS: bleeding problems, blood clots and blood transfusions   Endocrine ROS: hot flashes, palpitations, temperature intolerance and unexpected weight changes   Respiratory ROS: hemoptysis and pleuritic pain, cough, shortness of breath and wheezing   Cardiovascular ROS: chest pain, orthopnea and paroxysmal nocturnal dyspnea   Gastrointestinal ROS: abdominal pain, change in bowel habits, change in stools, heartburn, hematemesis and nausea/vomiting   Genito-Urinary ROS: dysuria, hematuria and nocturia   Musculoskeletal ROS: joint pain, joint stiffness and joint swelling   Neurological ROS: behavioral changes, headaches, memory loss, seizures, tremors and visual changes   Dermatological ROS: dry skin, lumps, nail changes, pruritus and rash    PHYSICAL EXAM     Vital Signs (most  recent): BP 112/71   Pulse 79   Temp 97 F (36.1 C)   Resp 18   Ht 1.727 m (5\' 8" )   Wt 80.7 kg (178 lb)   SpO2 95%   BMI 27.06 kg/m   Constiutional: No apparent distress. Awake and alert,oriented x 3,resting in bed,NAD. Patient speaks freely in full sentences.   HEENT: PERRL, no scleral icterus or conjunctival pallor, no nasal discharge, MMM, oropharynx without erythema or exudate  Neck: trachea midline, supple, no cervical or supraclavicular lymphadenopathy or masses  Cardiovascular: RRR, normal S1 S2, no murmurs, no JVD, no carotid bruits. Non-displaced PMI.  Respiratory: Normal rate. No retractions or increased work of breathing. Clear to auscultation and percussion bilaterally.  Gastrointestinal: +BS, non-distended, soft, non-tender, no rebound or guarding, no hepatosplenomegaly  Genitourinary: no suprapubic or costovertebral angle tenderness  Musculoskeletal: ROM and motor strength grossly normal. No clubbing, edema, or cyanosis. DP and radial pulses 2+ and symmetric,capillary refill normal  Skin: Right neck surgical incision site is clean,dry and intact  Neurologic: EOMI, CN 2-12 grossly intact. no gross motor or sensory deficits, patellar and bicep DTR 2+ and symmetric  Psychiatric: affect and mood appropriate. The patient is alert, interactive, appropriate.    LABS & IMAGING     Recent Results (from the past 24 hour(s))   Type and Screen    Collection Time: 06/11/16  6:26 AM   Result Value Ref Range    ABO Rh O POS     AB Screen Gel NEG    RBC OR HOLD Blood Product    Collection Time: 06/11/16  6:26 AM   Result Value Ref Range    RBC Leukoreduced RBC Leukoreduced     BLUNIT [id number]     Status selected     PRODUCT CODE (NON READABLE) E0336V00     Expiration Date [id number]     UTYPE O POS     RBC Leukoreduced RBC Leukoreduced     BLUNIT [id number]     Status selected     PRODUCT CODE (NON READABLE) E0336V00     Expiration Date [id number]     UTYPE O POS        IMAGING:  OTHER RADIOLOGY  SCANS - 130560 SS   Final Result      MRI SCAN   Final Result      DOPPLER STUDIES SCAN   Final Result          ASSESSMENT & PLAN     Audrey Gilmore  Audrey Gilmore Meeker Alleyne is a 61 y.o. female status post Right Carotid End-arterectomy    1. Status post Right Carotid End-arterectomy  Post-op care per primary team  Tramadol PRN pain  Encouraged ambulation as tolerated  Local wound care     2. Anxiety  Ativan PRN    Thank for for the opportunity to be involved in Dorena Cookey Polcyn's care. We will continue to follow.    Areta Haber MD  06/11/2016 12:40 PM

## 2016-06-11 NOTE — H&P (Signed)
History and Physical currently available, in Epic or on paper, has been reviewed, and there are no major changes. Pt. seen and examined by me prior to procedure.     Adriyana Greenbaum A Doyne Ellinger, MD

## 2016-06-11 NOTE — Transfer of Care (Signed)
Anesthesia Transfer of Care Note    Patient: Audrey Gilmore    Procedures performed: Procedure(s):  ENDARTERECTOMY, CAROTID ARTERY WITH PATCH ANGIOPLASTY    Anesthesia type: Regional Anesthesia    Patient location:Phase I PACU    Last vitals:   Vitals:    06/11/16 0851   BP: 121/69   Pulse: 92   Resp: 16   Temp: 36.2 C (97.2 F)   SpO2: 96%       Post pain: Patient not complaining of pain, continue current therapy      Mental Status:awake    Respiratory Function: tolerating room air    Cardiovascular: stable    Nausea/Vomiting: patient not complaining of nausea or vomiting    Hydration Status: adequate    Post assessment: no apparent anesthetic complications    Signed by: Lovette Cliche  06/11/16 8:53 AM

## 2016-06-11 NOTE — Progress Notes (Signed)
Pt complaining of chest discomfort across chest. Dr Annia Belt here checking pt. Her rhythm is NSR. VSS. Pt feels its from her lipitor side effect and Dr Annia Belt agrees. Pt cleared to go to 5th floor

## 2016-06-11 NOTE — Discharge Instr - AVS First Page (Signed)
[facility] DISCHARGE INSTRUCTIONS      Surgeon: Miyonna Ormiston MD    Wound Care:  Remove steri strips in 4 days  May shower in AM (no bath for 7 days)  No heavy lifting for 10 days.    Pain Control:  Some pain and swelling is normal after surgery.  You may take the pain medication as prescribed.        Driving:  When pain is controlled and not taking pain medications      Diet:   You may not feel hungry all them time or you may eat a small amount and feel full.  This is ok.  Some people find eating multiple small meals is easier than 3 large meals.  Continue to drink lots of clear fluids.      Reasons to call the doctor:  Call your doctor or go to emergency room if you have any of the following:   Fever greater than 101.5 F   Pus draining from your wound   Call if develop weakness of either extremity, inability to talk, or significant swelling of the neck.    Follow up Care:   Resume all home medications   Call the office ([phone]) to schedule in 4 weeks and see me.

## 2016-06-11 NOTE — Anesthesia Postprocedure Evaluation (Signed)
Anesthesia Post Evaluation    Patient: Audrey Gilmore    Procedures performed: Procedure(s):  ENDARTERECTOMY, CAROTID ARTERY WITH PATCH ANGIOPLASTY    Anesthesia type: Regional Anesthesia    Patient location:Phase I PACU    Last vitals:   Vitals:    06/11/16 1347   BP: 117/66   Pulse: 80   Resp: 16   Temp: 36.3 C (97.3 F)   SpO2:        Post pain: Patient not complaining of pain, continue current therapy      Mental Status:awake    Respiratory Function: tolerating room air    Cardiovascular: stable    Nausea/Vomiting: patient not complaining of nausea or vomiting    Hydration Status: adequate    Post assessment: no apparent anesthetic complications    Signed byDanelle Berry, 06/11/2016 4:52 PM

## 2016-06-12 ENCOUNTER — Other Ambulatory Visit: Payer: Self-pay

## 2016-06-12 ENCOUNTER — Encounter: Payer: Self-pay | Admitting: Specialist

## 2016-06-12 LAB — RED BLOOD CELLS OR HOLD
Expiration Date: 201803022359
Expiration Date: 201803022359
UTYPE: O POS
UTYPE: O POS

## 2016-06-12 LAB — CBC AND DIFFERENTIAL
Absolute NRBC: 0 10*3/uL
Basophils Absolute Automated: 0.03 10*3/uL (ref 0.00–0.20)
Basophils Automated: 0.3 %
Eosinophils Absolute Automated: 0.01 10*3/uL (ref 0.00–0.70)
Eosinophils Automated: 0.1 %
Hematocrit: 32.4 % — ABNORMAL LOW (ref 37.0–47.0)
Hgb: 10.9 g/dL — ABNORMAL LOW (ref 12.0–16.0)
Immature Granulocytes Absolute: 0.04 10*3/uL
Immature Granulocytes: 0.4 %
Lymphocytes Absolute Automated: 1.66 10*3/uL (ref 0.50–4.40)
Lymphocytes Automated: 17 %
MCH: 31.2 pg (ref 28.0–32.0)
MCHC: 33.6 g/dL (ref 32.0–36.0)
MCV: 92.8 fL (ref 80.0–100.0)
MPV: 11.1 fL (ref 9.4–12.3)
Monocytes Absolute Automated: 0.63 10*3/uL (ref 0.00–1.20)
Monocytes: 6.5 %
Neutrophils Absolute: 7.38 10*3/uL (ref 1.80–8.10)
Neutrophils: 75.7 %
Nucleated RBC: 0 /100 WBC (ref 0.0–1.0)
Platelets: 209 10*3/uL (ref 140–400)
RBC: 3.49 10*6/uL — ABNORMAL LOW (ref 4.20–5.40)
RDW: 12 % (ref 12–15)
WBC: 9.75 10*3/uL (ref 3.50–10.80)

## 2016-06-12 LAB — BASIC METABOLIC PANEL
Anion Gap: 8 (ref 5.0–15.0)
BUN: 12 mg/dL (ref 7–19)
CO2: 25 mEq/L (ref 22–29)
Calcium: 8.9 mg/dL (ref 8.5–10.5)
Chloride: 108 mEq/L (ref 100–111)
Creatinine: 0.8 mg/dL (ref 0.6–1.0)
Glucose: 111 mg/dL — ABNORMAL HIGH (ref 70–100)
Potassium: 3.9 mEq/L (ref 3.5–5.1)
Sodium: 141 mEq/L (ref 136–145)

## 2016-06-12 LAB — GFR: EGFR: >60

## 2016-06-12 MED ORDER — BENZOCAINE-MENTHOL 15-3.6 MG MT LOZG
1.0000 | LOZENGE | OROMUCOSAL | Status: DC | PRN
Start: 2016-06-12 — End: 2016-06-12

## 2016-06-12 MED ORDER — ACETAMINOPHEN-CODEINE 300-30 MG PO TABS
1.0000 | ORAL_TABLET | ORAL | 0 refills | Status: DC | PRN
Start: 2016-06-12 — End: 2017-08-14
  Filled 2016-06-12: qty 10, 2d supply, fill #0

## 2016-06-12 NOTE — OT Eval Note (Signed)
[facility]   Occupational Therapy Evaluation     Patient: Audrey Gilmore    MRN#: [id number]   Unit: 5NEW ORTHOPEDICS  Bed: I433/I951-88    Time of treatment: Time Calculation  OT Received On: 06/12/16  Start Time: 1100  Stop Time: 1107  Time Calculation (min): 7 min    Consult received for Audrey Gilmore for OT Evaluation and Treatment.  Patient's medical condition is appropriate for Occupational therapy intervention at this time.    Medicare ID# - Patient is not covered by Medicare.    Assessment:   Pt is at baseline, independent for feeding, grooming, upper body dressing, lower body dressing, toileting, and simulated bathing. No con't OT needs.   Discharge from therapy.    Interdisciplinary Communication   Patient is in bedside chair and call bell/bed alarm set within reach.  Spoke with RN regarding results of evaluation.    Plan:   Discharge from Occupational Therapy.    Education:   Ed pt on role of OT, neck ROM movements, and home safety.    Evaluation:     Precautions and Contraindications:   Precautions  Weight Bearing Status: no restrictions    Medical Diagnosis: Carotid stenosis, right [I65.21]    History of Present Illness: Audrey Gilmore is a 61 y.o. female admitted on 06/11/2016 with   Procedure(s):  ENDARTERECTOMY, CAROTID ARTERY WITH PATCH ANGIOPLASTY    1 Day Post-Op  -------------------     Patient Active Problem List   Diagnosis   . Carotid stenosis, asymptomatic, right        Past Medical/Surgical History:  Past Medical History:   Diagnosis Date   . Arthritis    . Bursitis    . Carotid stenosis, asymptomatic, right 05/16/2016   . Chronic pain     Follows w/Pain Management for many years   . Dysphagia    . Gastroesophageal reflux disease    . Hiatal hernia    . Low back pain    . Osteopenia    . Palpitations     Due to dehydration/increased caffeine intake   . Paroxysmal SVT (supraventricular tachycardia)     Last episode ~1992   . Post-operative nausea and vomiting       Past  Surgical History:   Procedure Laterality Date   . CHOLECYSTECTOMY     . COLONOSCOPY     . ENDARTERECTOMY, CAROTID ARTERY Right 06/11/2016    Procedure: ENDARTERECTOMY, CAROTID ARTERY WITH PATCH ANGIOPLASTY;  Surgeon: Fransico Setters, MD;  Location: [facility];  Service: Cardiovascular;  Laterality: Right;   . HYSTERECTOMY     . TONSILLECTOMY     . TUBAL LIGATION           Social History/Prior level of function:  Prior Level of Function  Prior level of function: Independent with ADLs;Ambulates independently  Baseline Activity Level: Community ambulation  Driving: independent  Dressing - Upper Body: independent  Dressing - Lower Body: independent  Cooking: Yes;independent  Feeding: independent  Bathing: independent  Grooming: independent  Toileting: independent  Employment: Retired  DME Currently at Home: Other (Comment) (None)  Home Living Arrangements  Living Arrangements: Spouse/significant other  Type of Home: House  Home Layout: Multi-level;1/2 bath on main level;Bed/bath upstairs;Stairs to enter with rails (add number in comment) (8 STE w/ HR)  Bathroom Shower/Tub: Pension scheme manager: Standard  Bathroom Equipment: Built-in shower seat;Hand-held shower  Bathroom Accessibility: Accessible  DME Currently at Home: Other (  Comment) (None)    Orientation/Cognition: A&O x4    Pain: 0/10    Gross UE ROM: WFL    Gross UE strength: WFL    ADLs: Independent    Functional mobility: Independent    Patient Goal: To leave shortly      Signature: Marilynne Drivers, OT 06/12/2016 11:51 AM

## 2016-06-12 NOTE — Progress Notes (Signed)
CM role and services introduced to patient at the bedside. Patient will discharge home with spouse, no needs identified at this time.    Max Fickle RN BSN CMSRN   Case Manager ext [phone]

## 2016-06-12 NOTE — UM Notes (Signed)
NAME:  Audrey Gilmore,Audrey Gilmore  DOB:  17-Jan-1956       PMH:    Past Medical History:   Diagnosis Date   . Arthritis    . Bursitis    . Carotid stenosis, asymptomatic, right 05/16/2016   . Chronic pain     Follows w/Pain Management for many years   . Dysphagia    . Gastroesophageal reflux disease    . Hiatal hernia    . Low back pain    . Osteopenia    . Palpitations     Due to dehydration/increased caffeine intake   . Paroxysmal SVT (supraventricular tachycardia)     Last episode ~1992   . Post-operative nausea and vomiting           ELECTIVE PROCEDURE DONE 06/11/16:    ENDARTERECTOMY, CAROTID ARTERY WITH PATCH ANGIOPLASTY      06/11/16 7829  Admit to Inpatient        Admitted to surgical unit post-op.    Current Facility-Administered Medications   Medication Dose Route Frequency   . aspirin EC  81 mg Oral Q24H   . LORazepam  1 mg Oral 2 times per day     . sodium chloride     . sodium chloride 100 mL/hr at 06/12/16 0627   . lactated ringers 20 mL/hr at 06/11/16 5621        ---------------06/12/16------------------  Vitals:    06/12/16 0724   BP: 117/59   Pulse: 77   Resp: 17   Temp: 97.4 F (36.3 C)   SpO2: 98%            _____________________________________________      ** This clinical review is compiled from documentation provided by the treatment team in the medical record. **    _____________________________________________    Jenell Milliner, RN, BSN  Utilization Review Case Manager  Case Management Department  [facility]  [address]  [place], [place] [address]  Phone:  [phone]  Fax:  [phone]  Case Management Main Phone:  [phone]    [facility] Tax ID:  [id number]  NPI:  [id number]    Please use fax number [phone] to provide authorization for hospital services or to request additional information.  ------------------------------------------------------------------------

## 2016-06-12 NOTE — Progress Notes (Signed)
Iv removed. Prescription filled and given to patient prior to discharge. The patient was discharged to home. The patient verbalized understanding of all discharge instructions. The patient's vital signs are stable. The patient is voiding adequately. The patient is to follow up with the MD. Patient was discharged to home.     Stroke teaching performed and stroke booklet given to patient.

## 2016-06-12 NOTE — Plan of Care (Signed)
Problem: Moderate/High Fall Risk Score >5  Goal: Patient will remain free of falls  Outcome: Progressing  Patient will be free from falls. Purposeful Rounding performed. Patient encouraged to call for assistance prior to getting out of bed. Call bell/light and bedside table are within reach and bed maintained in low position. Masimo on. Scds in use. Will continue to monitor patient status and medicate prn.     Problem: Pain  Goal: Pain at adequate level as identified by patient  Outcome: Progressing  Pain controlled with prn medications. Will continue to monitor patient status.    06/12/16 1122   Goal/Interventions addressed this shift   Pain at adequate level as identified by patient Identify patient comfort function goal;Assess for risk of opioid induced respiratory depression, including snoring/sleep apnea. Alert healthcare team of risk factors identified.;Reassess pain within 30-60 minutes of any procedure/intervention, per Pain Assessment, Intervention, Reassessment (AIR) Cycle;Evaluate if patient comfort function goal is met;Evaluate patient's satisfaction with pain management progress;Offer non-pharmacological pain management interventions       Problem: Psychosocial and Spiritual Needs  Goal: Demonstrates ability to cope with hospitalization/illness  Outcome: Progressing  Family member at bedside for support.    06/12/16 1122   Goal/Interventions addressed this shift   Demonstrates ability to cope with hospitalizations/illness Encourage verbalization of feelings/concerns/expectations;Encourage participation in diversional activity;Reinforce positive adaptation of new coping behaviors;Encourage patient to set small goals for self;Provide quiet environment;Assist patient to identify own strengths and abilities;Include patient/ patient care companion in decisions;Communicate referral to spiritual care as appropriate       Problem: Carotid Endarterectomy  Goal: Mobility/activity is maintained at optimum level  for patient  Outcome: Progressing  Patient ambulating in hallway. Encouraged to I/S use at least 10x per hour.    06/12/16 1122   Goal/Interventions addressed this shift   Mobility/activity is maintained at optimum level for patient Increase mobility as tolerated/progressive mobility;Teach/reinforce use of incentive spirometer 10 times per hour while awake, cough and deep breath as needed     Goal: Pain at adequate level as identified by patient  Outcome: Progressing  Pain controlled with prn medications. Will continue to monitor patient status.    06/12/16 1122   Goal/Interventions addressed this shift   Pain at adequate level as identified by patient Identify patient comfort function goal;Assess for risk of opioid induced respiratory depression, including snoring/sleep apnea. Alert healthcare team of risk factors identified.;Reassess pain within 30-60 minutes of any procedure/intervention, per Pain Assessment, Intervention, Reassessment (AIR) Cycle;Evaluate if patient comfort function goal is met;Evaluate patient's satisfaction with pain management progress;Offer non-pharmacological pain management interventions

## 2016-06-12 NOTE — Plan of Care (Signed)
Problem: Pain  Goal: Pain at adequate level as identified by patient  Outcome: Progressing  RN administering prn pain medication for comfort.    06/12/16 0321   Goal/Interventions addressed this shift   Pain at adequate level as identified by patient Identify patient comfort function goal;Assess for risk of opioid induced respiratory depression, including snoring/sleep apnea. Alert healthcare team of risk factors identified.;Assess pain on admission, during daily assessment and/or before any "as needed" intervention(s);Reassess pain within 30-60 minutes of any procedure/intervention, per Pain Assessment, Intervention, Reassessment (AIR) Cycle;Evaluate if patient comfort function goal is met;Offer non-pharmacological pain management interventions;Consult/collaborate with Pain Service;Consult/collaborate with Physical Therapy, Occupational Therapy, and/or Speech Therapy;Include patient/patient care companion in decisions related to pain management as needed;Evaluate patient's satisfaction with pain management progress       Problem: Carotid Endarterectomy  Goal: Pain at adequate level as identified by patient  Outcome: Progressing  RN administering prn pain medication for comfort.    06/12/16 0321   Goal/Interventions addressed this shift   Pain at adequate level as identified by patient Identify patient comfort function goal;Assess for risk of opioid induced respiratory depression, including snoring/sleep apnea. Alert healthcare team of risk factors identified.;Assess pain on admission, during daily assessment and/or before any "as needed" intervention(s);Reassess pain within 30-60 minutes of any procedure/intervention, per Pain Assessment, Intervention, Reassessment (AIR) Cycle;Evaluate if patient comfort function goal is met;Offer non-pharmacological pain management interventions;Consult/collaborate with Pain Service;Consult/collaborate with Physical Therapy, Occupational Therapy, and/or Speech Therapy;Include  patient/patient care companion in decisions related to pain management as needed;Evaluate patient's satisfaction with pain management progress     Goal: Hemodynamic stability  Outcome: Progressing  Patient assessed, vitals stable, anterior neck precautions maintained.    06/12/16 0321   Goal/Interventions addressed this shift   Hemodynamic Stability Monitor/assess vital signs/cardiac rhythm per unit protocol;Monitor for signs and symptoms of hyperperfusion syndrome;Continuous blood pressure monitoring/assessment per LIP order     Goal: Mobility/activity is maintained at optimum level for patient  Outcome: Progressing  Patient is ambulatory, ambulating in room and in the halls.   06/12/16 0321   Goal/Interventions addressed this shift   Mobility/activity is maintained at optimum level for patient Elevate head of bed per LIP orders;Teach/reinforce use of incentive spirometer 10 times per hour while awake, cough and deep breath as needed;Bedrest until patient stable. Get patient OOB to chair on POD 0 for meals.;Increase mobility as tolerated/progressive mobility     Goal: Neurologically intact  Outcome: Progressing  Neuro checks within normal limits.   06/12/16 0321   Goal/Interventions addressed this shift   Neurologically intact Assess/report to LIP signs and symptoms associated with neurological changes;Dysphagia screen by Speech Therapy if signs of dysphasia noted by RN. Keep patient NPO if patient fails screening.

## 2016-06-12 NOTE — Progress Notes (Signed)
Patient interviewed and denies any anesthesia complications such as nausea or vomiting. Mild sore throat- lozenges ordered.

## 2016-06-12 NOTE — Progress Notes (Signed)
Status post right carotid artery endarterectomy.  Has some incisional pain.  Vital signs are stable.  Neck is soft.  No hematoma.  Neurologically completely intact.  Surgically stable for discharge and follow-up in a few weeks.  This was explained to the patient and has agreed.

## 2016-06-12 NOTE — Discharge Instructions (Signed)
Acetaminophen, Codeine Phosphate Oral tablet  What is this medicine?  ACETAMINOPHEN; CODEINE (a set a MEE noe fen; KOE deen) is a pain reliever. It is used to treat mild to moderate pain.  This medicine may be used for other purposes; ask your health care provider or pharmacist if you have questions.  What should I tell my health care provider before I take this medicine?  They need to know if you have any of these conditions:   brain tumor   Crohn's disease, inflammatory bowel disease, or ulcerative colitis   drug abuse or addiction   head injury   heart or circulation problems   if you often drink alcohol   kidney disease or problems going to the bathroom   liver disease   lung disease, asthma, or breathing problems   an unusual or allergic reaction to acetaminophen, codeine, salicylates, other opioid analgesics, other medicines, foods, dyes, or preservatives   pregnant or trying to get pregnant   breast-feeding  How should I use this medicine?  Take this medicine by mouth with a full glass of water. Follow the directions on the prescription label. If the medicine upsets your stomach, take the medicine with food or milk. Do not take more medicine than you are told to take.   Talk to your pediatrician regarding the use of this medicine in children. Special care may be needed.  Overdosage: If you think you have taken too much of this medicine contact a poison control center or emergency room at once.  NOTE: This medicine is only for you. Do not share this medicine with others.  What if I miss a dose?  If you miss a dose, take it as soon as you can. If it is almost time for your next dose, take only that dose. Do not take double or extra doses.  What may interact with this medicine?   alcohol   antihistamines   benztropine   drugs for bladder problems like solifenacin, trospium, oxybutynin, tolterodine, hycosamine, and methscopolamine   drugs for breathing problems like ipratropium and  tiotropium   drugs for certain stomach or intestine problems like propantheline, homatropine methylbromide, glycopyrrolate, atropine, belladonna, and dicyclomine   medicines for depression, anxiety, or psychotic disturbances   medicines for sleep   muscle relaxants   naltrexone   narcotic medicines (opiates) for pain   phenothiazines like perphenazine, thioridazine, chlorpromazine, mesoridazine, fluphenazine, prochlorperazine, promazine, trifluoperazine   scopolamine   tramadol   trihexyphenidyl  This list may not describe all possible interactions. Give your health care provider a list of all the medicines, herbs, non-prescription drugs, or dietary supplements you use. Also tell them if you smoke, drink alcohol, or use illegal drugs. Some items may interact with your medicine.  What should I watch for while using this medicine?  Tell your doctor or health care professional if your pain does not go away, if it gets worse, or if you have new or a different type of pain. You may develop tolerance to the medication. Tolerance means that you will need a higher dose of the medication for pain relief. Tolerance is normal and is expected if you take the medicine for a long time.  Do not suddenly stop taking your medicine because you may develop a severe reaction. Your body becomes used to the medicine. This does NOT mean you are addicted. Addiction is a behavior related to getting and using a drug for a non medical reason. If you have pain, you have  a medical reason to take pain medicine. Your doctor will tell you how much medicine to take. If your doctor wants you to stop the medicine, the dose will be slowly lowered over time to avoid any side effects.  You may get drowsy or dizzy. Do not drive, use machinery, or do anything that needs mental alertness until you know how this medicine affects you. Do not stand or sit up quickly, especially if you are an older patient. This reduces the risk of dizzy or fainting  spells. Alcohol may interfere with the effect of this medicine. Avoid alcoholic drinks.  There are different types of narcotic medicines (opiates) for pain. If you take more than one type at the same time, you may have more side effects. Give your health care provider a list of all medicines you use. Your doctor will tell you how much medicine to take. Do not take more medicine than directed. Call emergency for help if you have problems breathing.  The medicine will cause constipation. Try to have a bowel movement at least every 2 to 3 days. If you do not have a bowel movement for 3 days, call your doctor or health care professional.  Do not take Tylenol (acetaminophen) or medicines that have acetaminophen with this medicine. Too much acetaminophen can be very dangerous. Many nonprescription medicines contain acetaminophen. Always read the labels carefully to avoid taking more acetaminophen.  Immediately call your physician or get emergency help if you are breast-feeding and your baby is sleepier than usual, is limp, or has difficulty breastfeeding or breathing.  What side effects may I notice from receiving this medicine?  Side effects that you should report to your doctor or health care professional as soon as possible:   allergic reactions like skin rash, itching or hives, swelling of the face, lips, or tongue   breathing difficulties, wheezing   confusion   light headedness or fainting spells   severe stomach pain   yellowing of the skin or the whites of the eyes  Side effects that usually do not require medical attention (report to your doctor or health care professional if they continue or are bothersome):   dizziness   drowsiness   nausea, vomiting  This list may not describe all possible side effects. Call your doctor for medical advice about side effects. You may report side effects to FDA at 1-800-FDA-1088.  Where should I keep my medicine?  Keep out of the reach of children. This medicine can be  abused. Keep your medicine in a safe place to protect it from theft. Do not share this medicine with anyone. Selling or giving away this medicine is dangerous and against the law.  Store at room temperature between 15 and 30 degrees C (59 and 86 degrees F). Protect from light. Keep container tightly closed.  Throw away any unused medicine after the expiration date. Discard unused medicine and used packaging carefully. Pets and children can be harmed if they find used or lost packages.  NOTE: This sheet is a summary. It may not cover all possible information. If you have questions about this medicine, talk to your doctor, pharmacist, or health care provider.  NOTE:This sheet is a summary. It may not cover all possible information. If you have questions about this medicine, talk to your doctor, pharmacist, or health care provider. Copyright 2015 [employer] Standard

## 2016-06-21 ENCOUNTER — Telehealth (INDEPENDENT_AMBULATORY_CARE_PROVIDER_SITE_OTHER): Payer: Self-pay | Admitting: Family

## 2016-06-21 NOTE — Telephone Encounter (Signed)
Patient is status post right carotid endarterectomy performed by Dr. Lucianne Muss on January 31.  The patient continues to notice abnormalities of her incisional site and the feeling of tightness.  She denies any evidence of edema or hematoma.  She is a difficulty breathing or swallowing.  She continues to have headache, which she had preoperatively.  And she does note a slight ringing in her right ear.    Patient was informed that recovered from the surgery is not quick, and she will start feeling more like herself in about 3 weeks.  Incisional site take several months to undergo total transformation, so the symptoms.  She has right now are completely within normal limits.  The patient was encouraged.  At the symptoms she is having.  We will continue to improve.  She was encouraged to follow up with her PCP regards to her headache.  We will see the patient back in a few weeks for repeat ultrasound and a plan with Dr. Lucianne Muss

## 2016-07-03 ENCOUNTER — Ambulatory Visit (INDEPENDENT_AMBULATORY_CARE_PROVIDER_SITE_OTHER): Payer: BLUE CROSS/BLUE SHIELD | Admitting: Specialist

## 2016-07-03 ENCOUNTER — Ambulatory Visit (INDEPENDENT_AMBULATORY_CARE_PROVIDER_SITE_OTHER): Payer: BLUE CROSS/BLUE SHIELD

## 2016-07-03 ENCOUNTER — Encounter (INDEPENDENT_AMBULATORY_CARE_PROVIDER_SITE_OTHER): Payer: Self-pay | Admitting: Specialist

## 2016-07-03 VITALS — BP 152/89 | HR 89 | Ht 68.0 in | Wt 176.0 lb

## 2016-07-03 DIAGNOSIS — I6521 Occlusion and stenosis of right carotid artery: Secondary | ICD-10-CM

## 2016-07-03 NOTE — Progress Notes (Signed)
[facility]    Chief Complaint   Patient presents with   . Follow-up     to endarterectomy          History of Present Illness     Audrey Gilmore is a 61 y.o. female who presents Status post a right carotid artery endarterectomy approximately 3 weeks ago.  Patient complains of tension headache which she had prior to the procedure and now extends to her neck.  She is neurologically completely intact.  Patient had episodes of chest tightness which required hospital admission, which was worked up and did not appear to be cardiac in origin.    Past Medical History     Past Medical History:   Diagnosis Date   . Arthritis    . Bursitis    . Carotid stenosis, asymptomatic, right 05/16/2016   . Chronic pain     Follows w/Pain Management for many years   . Dysphagia    . Gastroesophageal reflux disease    . Hiatal hernia    . Low back pain    . Osteopenia    . Palpitations     Due to dehydration/increased caffeine intake   . Paroxysmal SVT (supraventricular tachycardia)     Last episode ~1992   . Post-operative nausea and vomiting        Allergies     Allergies   Allergen Reactions   . Bactrim [Sulfamethoxazole-Trimethoprim] Hives and Itching   . Cephalosporins Hives and Itching   . Ciprofloxacin Other (See Comments)     Severe joint pain   . Dye [Iodinated Diagnostic Agents] Itching and Other (See Comments)     Sneezing, itching   . Levaquin [Levofloxacin] Other (See Comments)     Severe joint pain   . Lipitor [Atorvastatin] Other (See Comments)     Muscle ache/spasms/cramps  Headache   . Lidocaine Palpitations       Medications     Current Outpatient Prescriptions on File Prior to Visit   Medication Sig Dispense Refill   . Acetaminophen-Codeine (TYLENOL/CODEINE #3) 300-30 MG per tablet Take 1 tablet by mouth every 4 (four) hours as needed for Pain. 10 tablet 0   . aspirin EC 81 MG EC tablet Take 81 mg by mouth daily.     . Cholecalciferol (VITAMIN D3) 50000 units Cap TAKE ONE CAPSULE BY MOUTH WEEKLY  2   .  Lactobacillus Rhamnosus, GG, (CULTURELLE PO) Take 1 capsule by mouth daily.     Marland Kitchen LORazepam (ATIVAN) 0.5 MG tablet Take 0.5 mg by mouth nightly as needed.     Marland Kitchen LORazepam (ATIVAN) 1 MG tablet Take 1 mg by mouth every 6 (six) hours as needed.Per pt takes at 3pm and 6pm every day     . Multiple Vitamins-Minerals (EMERGEN-C IMMUNE PO) Take by mouth daily as needed.     . traMADol (ULTRAM) 50 MG tablet TAKE 2 TABLETS BY MOUTH EVERY 8 HOURS   Takes BID  1     No current facility-administered medications on file prior to visit.        Review of Systems     Constitutional: Negative for fevers and chills  Skin: No rash or lesions  Respiratory: Negative for cough, wheezing, or hemoptysis  Cardiovascular: as per HPI  Gastrointestinal: Negative for abdominal pain, nausea, vomiting and diarrhea  Musculoskeletal:  No arthritic symptoms  Genitourinary: Negative for dysuria  All other systems were reviewed and are negative except what is stated in the HPI  Physical Exam     Vitals:    07/03/16 1146   BP: 152/89   Pulse: 89       Body mass index is 26.76 kg/m.    General: Patient appears their stated age, well-nourished. Alert and in no apparent distress.  Lungs: Respiratory effort unlabored, chest expansion symmetric.  Cardiac: RRR, no carotid bruits, no JVD.   Extremities warm, Right Femoral pulses 2+, Left Femoral 2+, Right popliteal 2+, Left popliteal 2+, Right DP 2+, Left DP 2+, Right PT 2+, Left PT 2+,   Abd: Soft, nondistended, nontender. No guarding or rebound, No mid line pulsatile mass   ZOX:WRUE ROM in all 4 extremities, symmetric Healing right neck incision  Skin: Color appropriate for race, Skin warm, dry, no gangrene, no non healing ulcers, no varicose veins , no hyperpigmentation, no lipo-dermatosclerosis  Neuro: Good insight and judgment, oriented to person, place, and time CN II-XII intact, gross motor and sensory intact      Labs     CBC:   WBC   Date/Time Value Ref Range Status   06/12/2016 04:28 AM 9.75  3.50 - 10.80 x10 3/uL Final     RBC   Date/Time Value Ref Range Status   06/12/2016 04:28 AM 3.49 (L) 4.20 - 5.40 x10 6/uL Final     Hgb   Date/Time Value Ref Range Status   06/12/2016 04:28 AM 10.9 (L) 12.0 - 16.0 g/dL Final     Hematocrit   Date/Time Value Ref Range Status   06/12/2016 04:28 AM 32.4 (L) 37.0 - 47.0 % Final     MCV   Date/Time Value Ref Range Status   06/12/2016 04:28 AM 92.8 80.0 - 100.0 fL Final     MCHC   Date/Time Value Ref Range Status   06/12/2016 04:28 AM 33.6 32.0 - 36.0 g/dL Final     RDW   Date/Time Value Ref Range Status   06/12/2016 04:28 AM 12 12 - 15 % Final     Platelets   Date/Time Value Ref Range Status   06/12/2016 04:28 AM 209 140 - 400 x10 3/uL Final       CMP:   Sodium   Date/Time Value Ref Range Status   06/12/2016 04:28 AM 141 136 - 145 mEq/L Final     Potassium   Date/Time Value Ref Range Status   06/12/2016 04:28 AM 3.9 3.5 - 5.1 mEq/L Final     Chloride   Date/Time Value Ref Range Status   06/12/2016 04:28 AM 108 100 - 111 mEq/L Final     CO2   Date/Time Value Ref Range Status   06/12/2016 04:28 AM 25 22 - 29 mEq/L Final     Glucose   Date/Time Value Ref Range Status   06/12/2016 04:28 AM 111 (H) 70 - 100 mg/dL Final     Comment:     ADA guidelines for diabetes mellitus:  Fasting:  Equal to or greater than 126 mg/dL  Random:   Equal to or greater than 200 mg/dL       BUN   Date/Time Value Ref Range Status   06/12/2016 04:28 AM 12 7 - 19 mg/dL Final     Anion Gap   Date/Time Value Ref Range Status   06/12/2016 04:28 AM 8.0 5.0 - 15.0 Final       Lipid Panel No results found for: CHOL, TRIG, HDL    Coags: No results found for: PT, INR, PTT    Assessment and Plan  1. Carotid stenosis, asymptomatic, right         Patient is doing very well post right carotid endarterectomy and I believe this is part tension headache which should resolve in the next month or so.  She states that had this pain when she was started on Lipitor but the pain has not improved with stopping the  Lipitor.  The carotid ultrasound revealed a widely patent right internal carotid artery.  The incision has healed well, and I will be seeing him back in the office in 6 weeks.  This was explained to the patient and has agreed.    [name], MD, FACS, RPVI  [facility]  Chief, Section of Vascular Surgery  [place]  [facility]

## 2016-07-03 NOTE — Procedures (Signed)
Full report to be scanned into EPIC

## 2016-07-10 ENCOUNTER — Other Ambulatory Visit (INDEPENDENT_AMBULATORY_CARE_PROVIDER_SITE_OTHER): Payer: BLUE CROSS/BLUE SHIELD

## 2016-07-10 ENCOUNTER — Ambulatory Visit (INDEPENDENT_AMBULATORY_CARE_PROVIDER_SITE_OTHER): Payer: BLUE CROSS/BLUE SHIELD | Admitting: Specialist

## 2016-08-15 ENCOUNTER — Ambulatory Visit (INDEPENDENT_AMBULATORY_CARE_PROVIDER_SITE_OTHER): Payer: BLUE CROSS/BLUE SHIELD | Admitting: Specialist

## 2016-08-15 ENCOUNTER — Encounter (INDEPENDENT_AMBULATORY_CARE_PROVIDER_SITE_OTHER): Payer: Self-pay | Admitting: Specialist

## 2016-08-15 VITALS — BP 139/89 | HR 76 | Ht 68.0 in | Wt 177.0 lb

## 2016-08-15 DIAGNOSIS — I6521 Occlusion and stenosis of right carotid artery: Secondary | ICD-10-CM

## 2016-08-15 NOTE — Progress Notes (Signed)
Vascular Surgery    Chief Complaint   Patient presents with   . Follow-up     6 week follow up to carotid          History of Present Illness     Audrey Gilmore is a 61 y.o. female who presents Status post right carotid artery endarterectomy.  Most of the pain in the right neck has resolved.  Patient complains of some discomfort.    Past Medical History     Past Medical History:   Diagnosis Date   . Arthritis    . Bursitis    . Carotid stenosis, asymptomatic, right 05/16/2016   . Chronic pain     Follows w/Pain Management for many years   . Dysphagia    . Gastroesophageal reflux disease    . Hiatal hernia    . Low back pain    . Osteopenia    . Palpitations     Due to dehydration/increased caffeine intake   . Paroxysmal SVT (supraventricular tachycardia)     Last episode ~1992   . Post-operative nausea and vomiting        Allergies     Allergies   Allergen Reactions   . Bactrim [Sulfamethoxazole-Trimethoprim] Hives and Itching   . Cephalosporins Hives and Itching   . Ciprofloxacin Other (See Comments)     Severe joint pain   . Dye [Iodinated Diagnostic Agents] Itching and Other (See Comments)     Sneezing, itching   . Levaquin [Levofloxacin] Other (See Comments)     Severe joint pain   . Lipitor [Atorvastatin] Other (See Comments)     Muscle ache/spasms/cramps  Headache   . Lidocaine Palpitations       Medications     Current Outpatient Prescriptions on File Prior to Visit   Medication Sig Dispense Refill   . Acetaminophen-Codeine (TYLENOL/CODEINE #3) 300-30 MG per tablet Take 1 tablet by mouth every 4 (four) hours as needed for Pain. 10 tablet 0   . aspirin EC 81 MG EC tablet Take 81 mg by mouth daily.     . Cholecalciferol (VITAMIN D3) 50000 units Cap TAKE ONE CAPSULE BY MOUTH WEEKLY  2   . Lactobacillus Rhamnosus, GG, (CULTURELLE PO) Take 1 capsule by mouth daily.     Marland Kitchen LORazepam (ATIVAN) 0.5 MG tablet Take 0.5 mg by mouth nightly as needed.     Marland Kitchen LORazepam (ATIVAN) 1 MG tablet Take 1 mg by mouth every 6  (six) hours as needed.Per pt takes at 3pm and 6pm every day     . Multiple Vitamins-Minerals (EMERGEN-C IMMUNE PO) Take by mouth daily as needed.     . traMADol (ULTRAM) 50 MG tablet TAKE 2 TABLETS BY MOUTH EVERY 8 HOURS   Takes BID  1     No current facility-administered medications on file prior to visit.        Review of Systems     Constitutional: Negative for fevers and chills  Skin: No rash or lesions  Respiratory: Negative for cough, wheezing, or hemoptysis  Cardiovascular: as per HPI  Gastrointestinal: Negative for abdominal pain, nausea, vomiting and diarrhea  Musculoskeletal:  No arthritic symptoms  Genitourinary: Negative for dysuria  All other systems were reviewed and are negative except what is stated in the HPI    Physical Exam     Vitals:    08/15/16 1325   BP: 139/89   Pulse: 76       Body mass index is  26.91 kg/m.    General: Patient appears their stated age, well-nourished. Alert and in no apparent distress.  Lungs: Respiratory effort unlabored, chest expansion symmetric.  Cardiac: RRR, no carotid bruits, no JVD. Right neck wound has completely healed  Extremities warm,    Abd: Soft, nondistended, nontender. No guarding or rebound, No mid line pulsatile mass   ZOX:WRUE ROM in all 4 extremities  Skin: Color appropriate for race, Skin warm,   Neuro: Good insight and judgment, oriented to person, place, and time CN II-XII intact, gross motor and sensory intact      Labs     CBC:   WBC   Date/Time Value Ref Range Status   06/12/2016 04:28 AM 9.75 3.50 - 10.80 x10 3/uL Final     RBC   Date/Time Value Ref Range Status   06/12/2016 04:28 AM 3.49 (L) 4.20 - 5.40 x10 6/uL Final     Hgb   Date/Time Value Ref Range Status   06/12/2016 04:28 AM 10.9 (L) 12.0 - 16.0 g/dL Final     Hematocrit   Date/Time Value Ref Range Status   06/12/2016 04:28 AM 32.4 (L) 37.0 - 47.0 % Final     MCV   Date/Time Value Ref Range Status   06/12/2016 04:28 AM 92.8 80.0 - 100.0 fL Final     MCHC   Date/Time Value Ref Range  Status   06/12/2016 04:28 AM 33.6 32.0 - 36.0 g/dL Final     RDW   Date/Time Value Ref Range Status   06/12/2016 04:28 AM 12 12 - 15 % Final     Platelets   Date/Time Value Ref Range Status   06/12/2016 04:28 AM 209 140 - 400 x10 3/uL Final       CMP:   Sodium   Date/Time Value Ref Range Status   06/12/2016 04:28 AM 141 136 - 145 mEq/L Final     Potassium   Date/Time Value Ref Range Status   06/12/2016 04:28 AM 3.9 3.5 - 5.1 mEq/L Final     Chloride   Date/Time Value Ref Range Status   06/12/2016 04:28 AM 108 100 - 111 mEq/L Final     CO2   Date/Time Value Ref Range Status   06/12/2016 04:28 AM 25 22 - 29 mEq/L Final     Glucose   Date/Time Value Ref Range Status   06/12/2016 04:28 AM 111 (H) 70 - 100 mg/dL Final     Comment:     ADA guidelines for diabetes mellitus:  Fasting:  Equal to or greater than 126 mg/dL  Random:   Equal to or greater than 200 mg/dL       BUN   Date/Time Value Ref Range Status   06/12/2016 04:28 AM 12 7 - 19 mg/dL Final     Anion Gap   Date/Time Value Ref Range Status   06/12/2016 04:28 AM 8.0 5.0 - 15.0 Final       Lipid Panel No results found for: CHOL, TRIG, HDL    Coags: No results found for: PT, INR, PTT    Assessment and Plan       1. Carotid stenosis, asymptomatic, right  US Carotid Duplex Dopp Comp Bilateral       Patient is doing well post right carotid endarterectomy.  States has some difficulty with concentration future will improve over time    Duplex ultrasound revealed widely patent right carotid artery endarterectomy.  Overall, she is doing very well and I will be seeing  her back in the office in 6 months.  Follow-up ultrasound.      [name], MD, FACS, RPVI  [facility]  Chief, Section of Vascular Surgery  [place]  [facility]

## 2016-10-02 ENCOUNTER — Emergency Department (HOSPITAL_COMMUNITY)
Admission: EM | Admit: 2016-10-02 | Discharge: 2016-10-02 | Disposition: A | Discharge status: Home / Self Care | Payer: Self-pay | Attending: Emergency Medicine | Admitting: Emergency Medicine

## 2016-10-02 ENCOUNTER — Emergency Department (HOSPITAL_COMMUNITY): Payer: Self-pay

## 2016-10-02 ENCOUNTER — Encounter (HOSPITAL_COMMUNITY): Payer: Self-pay

## 2016-10-02 DIAGNOSIS — Z5321 Procedure and treatment not carried out due to patient leaving prior to being seen by health care provider: Secondary | ICD-10-CM | POA: Insufficient documentation

## 2016-10-02 DIAGNOSIS — R51 Headache: Secondary | ICD-10-CM | POA: Insufficient documentation

## 2016-10-02 HISTORY — DX: Palpitations: R00.2

## 2016-10-02 HISTORY — DX: Supraventricular tachycardia, unspecified: I47.10

## 2016-10-02 HISTORY — DX: Spinal stenosis, site unspecified: M48.00

## 2016-10-02 HISTORY — DX: Supraventricular tachycardia: I47.1

## 2016-10-02 LAB — CBC WITH DIFFERENTIAL/PLATELET
BASOS ABS: 0 10*3/uL (ref 0.0–0.1)
BASOS PCT: 1 %
EOS ABS: 0.2 10*3/uL (ref 0.0–0.7)
EOS PCT: 2 %
HCT: 41.2 % (ref 36.0–46.0)
Hemoglobin: 13.7 g/dL (ref 12.0–15.0)
Lymphocytes Relative: 29 %
Lymphs Abs: 2.5 10*3/uL (ref 0.7–4.0)
MCH: 30.6 pg (ref 26.0–34.0)
MCHC: 33.3 g/dL (ref 30.0–36.0)
MCV: 92.2 fL (ref 78.0–100.0)
Monocytes Absolute: 0.6 10*3/uL (ref 0.1–1.0)
Monocytes Relative: 7 %
Neutro Abs: 5.3 10*3/uL (ref 1.7–7.7)
Neutrophils Relative %: 61 %
PLATELETS: 251 10*3/uL (ref 150–400)
RBC: 4.47 MIL/uL (ref 3.87–5.11)
RDW: 12 % (ref 11.5–15.5)
WBC: 8.6 10*3/uL (ref 4.0–10.5)

## 2016-10-02 LAB — COMPREHENSIVE METABOLIC PANEL
ALT: 18 U/L (ref 14–54)
AST: 22 U/L (ref 15–41)
Albumin: 4.3 g/dL (ref 3.5–5.0)
Alkaline Phosphatase: 104 U/L (ref 38–126)
Anion gap: 11 (ref 5–15)
BUN: 24 mg/dL — AB (ref 6–20)
CALCIUM: 9.7 mg/dL (ref 8.9–10.3)
CHLORIDE: 101 mmol/L (ref 101–111)
CO2: 25 mmol/L (ref 22–32)
CREATININE: 0.98 mg/dL (ref 0.44–1.00)
GFR calc Af Amer: >60 mL/min (ref >60)
GFR calc non Af Amer: >60 mL/min (ref >60)
GLUCOSE: 101 mg/dL — AB (ref 65–99)
Potassium: 3.7 mmol/L (ref 3.5–5.1)
SODIUM: 137 mmol/L (ref 135–145)
Total Bilirubin: 0.8 mg/dL (ref 0.3–1.2)
Total Protein: 7 g/dL (ref 6.5–8.1)

## 2016-10-02 LAB — I-STAT TROPONIN, ED: Troponin i, poc: 0 ng/mL (ref 0.00–0.08)

## 2016-10-02 NOTE — ED Notes (Signed)
Pt requesting staff to recheck her BP, it was 144/81

## 2016-10-02 NOTE — ED Triage Notes (Signed)
Pt presents with headache since January, palpitations that began today.  Pt reports having multiple steroid injections last week and began to take doxycyline for sinus infection with onset of palpitations.

## 2016-10-02 NOTE — ED Notes (Signed)
The pt reports that she has no ride home if she is not seen in the next hour  She has decided to leave  And may return tomorrow

## 2016-12-25 ENCOUNTER — Encounter (INDEPENDENT_AMBULATORY_CARE_PROVIDER_SITE_OTHER): Payer: Self-pay | Admitting: Specialist

## 2016-12-25 ENCOUNTER — Ambulatory Visit (INDEPENDENT_AMBULATORY_CARE_PROVIDER_SITE_OTHER): Payer: BLUE CROSS/BLUE SHIELD | Admitting: Specialist

## 2016-12-25 VITALS — BP 134/86 | HR 80 | Ht 68.0 in | Wt 178.0 lb

## 2016-12-25 DIAGNOSIS — I6521 Occlusion and stenosis of right carotid artery: Secondary | ICD-10-CM

## 2016-12-25 NOTE — Progress Notes (Signed)
[facility]    Chief Complaint   Patient presents with   . Follow-up         History of Present Illness     Audrey Gilmore is a 61 y.o. female who presents Status post right carotid artery endarterectomy January 2018.  Patient complains of pulsating, tinnitus.  She is neurologically completely intact.    Past Medical History     Past Medical History:   Diagnosis Date   . Arthritis    . Bursitis    . Carotid stenosis, asymptomatic, right 05/16/2016   . Chronic pain     Follows w/Pain Management for many years   . Dysphagia    . Gastroesophageal reflux disease    . Hiatal hernia    . Low back pain    . Osteopenia    . Palpitations     Due to dehydration/increased caffeine intake   . Paroxysmal SVT (supraventricular tachycardia)     Last episode ~1992   . Post-operative nausea and vomiting        Allergies     Allergies   Allergen Reactions   . Bactrim [Sulfamethoxazole-Trimethoprim] Hives and Itching   . Cephalosporins Hives and Itching   . Ciprofloxacin Other (See Comments)     Severe joint pain   . Dye [Iodinated Diagnostic Agents] Itching and Other (See Comments)     Sneezing, itching   . Levaquin [Levofloxacin] Other (See Comments)     Severe joint pain   . Lipitor [Atorvastatin] Other (See Comments)     Muscle ache/spasms/cramps  Headache   . Lidocaine Palpitations       Medications     Current Outpatient Prescriptions on File Prior to Visit   Medication Sig Dispense Refill   . Acetaminophen-Codeine (TYLENOL/CODEINE #3) 300-30 MG per tablet Take 1 tablet by mouth every 4 (four) hours as needed for Pain. 10 tablet 0   . aspirin EC 81 MG EC tablet Take 81 mg by mouth daily.     . Cholecalciferol (VITAMIN D3) 50000 units Cap TAKE ONE CAPSULE BY MOUTH WEEKLY  2   . Lactobacillus Rhamnosus, GG, (CULTURELLE PO) Take 1 capsule by mouth daily.     Marland Kitchen LORazepam (ATIVAN) 0.5 MG tablet Take 0.5 mg by mouth nightly as needed.     Marland Kitchen LORazepam (ATIVAN) 1 MG tablet Take 1 mg by mouth every 6 (six) hours as  needed.Per pt takes at 3pm and 6pm every day     . Multiple Vitamins-Minerals (EMERGEN-C IMMUNE PO) Take by mouth daily as needed.     . traMADol (ULTRAM) 50 MG tablet TAKE 2 TABLETS BY MOUTH EVERY 8 HOURS   Takes BID  1     No current facility-administered medications on file prior to visit.        Review of Systems     Constitutional: Negative for fevers and chills  Skin: No rash or lesions  Respiratory: Negative for cough, wheezing, or hemoptysis  Cardiovascular: as per HPI  Gastrointestinal: Negative for abdominal pain, nausea, vomiting and diarrhea  Musculoskeletal:  No arthritic symptoms  Genitourinary: Negative for dysuria  All other systems were reviewed and are negative except what is stated in the HPI    Physical Exam     Vitals:    12/25/16 1142   BP: 134/86   Pulse: 80       Body mass index is 27.06 kg/m.    General: Patient appears their stated age, well-nourished. Alert and  in no apparent distress.  Lungs: Respiratory effort unlabored, chest expansion symmetric.  Cardiac: RRR, no carotid bruits, no JVD.   Extremities warm,  Abd: Soft, nondistended, nontender. No guarding or rebound, No mid line pulsatile mass   ZOX:WRUE ROM in all 4 extremities, symmetric Well-healed right neck incision  Skin: Color appropriate for race, Skin warm, dry, no gangrene, no non healing ulcers, no varicose veins , no hyperpigmentation, no lipo-dermatosclerosis  Neuro: Good insight and judgment, oriented to person, place, and time CN II-XII intact, gross motor and sensory intact      Labs     CBC:   WBC   Date/Time Value Ref Range Status   06/12/2016 04:28 AM 9.75 3.50 - 10.80 x10 3/uL Final     RBC   Date/Time Value Ref Range Status   06/12/2016 04:28 AM 3.49 (L) 4.20 - 5.40 x10 6/uL Final     Hgb   Date/Time Value Ref Range Status   06/12/2016 04:28 AM 10.9 (L) 12.0 - 16.0 g/dL Final     Hematocrit   Date/Time Value Ref Range Status   06/12/2016 04:28 AM 32.4 (L) 37.0 - 47.0 % Final     MCV   Date/Time Value Ref Range  Status   06/12/2016 04:28 AM 92.8 80.0 - 100.0 fL Final     MCHC   Date/Time Value Ref Range Status   06/12/2016 04:28 AM 33.6 32.0 - 36.0 g/dL Final     RDW   Date/Time Value Ref Range Status   06/12/2016 04:28 AM 12 12 - 15 % Final     Platelets   Date/Time Value Ref Range Status   06/12/2016 04:28 AM 209 140 - 400 x10 3/uL Final       CMP:   Sodium   Date/Time Value Ref Range Status   06/12/2016 04:28 AM 141 136 - 145 mEq/L Final     Potassium   Date/Time Value Ref Range Status   06/12/2016 04:28 AM 3.9 3.5 - 5.1 mEq/L Final     Chloride   Date/Time Value Ref Range Status   06/12/2016 04:28 AM 108 100 - 111 mEq/L Final     CO2   Date/Time Value Ref Range Status   06/12/2016 04:28 AM 25 22 - 29 mEq/L Final     Glucose   Date/Time Value Ref Range Status   06/12/2016 04:28 AM 111 (H) 70 - 100 mg/dL Final     Comment:     ADA guidelines for diabetes mellitus:  Fasting:  Equal to or greater than 126 mg/dL  Random:   Equal to or greater than 200 mg/dL       BUN   Date/Time Value Ref Range Status   06/12/2016 04:28 AM 12 7 - 19 mg/dL Final     Anion Gap   Date/Time Value Ref Range Status   06/12/2016 04:28 AM 8.0 5.0 - 15.0 Final       Lipid Panel No results found for: CHOL, TRIG, HDL    Coags: No results found for: PT, INR, PTT    Assessment and Plan       1. Carotid stenosis, asymptomatic, right  US Carotid Duplex Dopp Comp Bilateral       My impression is that her tinnitus is probably not secondary to carotid disease.  I will obtain a duplex ultrasound examination of the carotid arteries.  If that is negative, then I may obtain an MRA of the intracerebral and extra cerebral vasculature to make sure  there is no gross abnormalities.  This was explained to the patient and has agreed to.  I will be seeing her back in the office after the duplex ultrasound has been completed.    [name], MD, FACS, RPVI  [facility]  Chief, Section of Vascular Surgery  [place]  [facility]

## 2016-12-28 ENCOUNTER — Ambulatory Visit (INDEPENDENT_AMBULATORY_CARE_PROVIDER_SITE_OTHER): Payer: BLUE CROSS/BLUE SHIELD

## 2016-12-28 DIAGNOSIS — I6521 Occlusion and stenosis of right carotid artery: Secondary | ICD-10-CM

## 2016-12-28 NOTE — Procedures (Signed)
Full report will follow into chart

## 2017-01-22 ENCOUNTER — Ambulatory Visit (INDEPENDENT_AMBULATORY_CARE_PROVIDER_SITE_OTHER): Payer: BLUE CROSS/BLUE SHIELD | Admitting: Specialist

## 2017-01-22 ENCOUNTER — Encounter (INDEPENDENT_AMBULATORY_CARE_PROVIDER_SITE_OTHER): Payer: Self-pay | Admitting: Specialist

## 2017-01-22 VITALS — BP 134/85 | HR 85 | Ht 68.0 in | Wt 175.0 lb

## 2017-01-22 DIAGNOSIS — I6521 Occlusion and stenosis of right carotid artery: Secondary | ICD-10-CM

## 2017-01-22 NOTE — Progress Notes (Signed)
[facility]    Chief Complaint   Patient presents with   . Follow-up     bilat carotid u/s         History of Present Illness     Audrey Gilmore is a 61 y.o. female who presents Status post right carotid artery endarterectomy January 2018.  Patient complains of pulsating, tinnitus.  She is neurologically completely intact.Patient has undergone follow-up carotid duplex ultrasound examination.    Past Medical History     Past Medical History:   Diagnosis Date   . Arthritis    . Bursitis    . Carotid stenosis, asymptomatic, right 05/16/2016   . Chronic pain     Follows w/Pain Management for many years   . Dysphagia    . Gastroesophageal reflux disease    . Hiatal hernia    . Low back pain    . Osteopenia    . Palpitations     Due to dehydration/increased caffeine intake   . Paroxysmal SVT (supraventricular tachycardia)     Last episode ~1992   . Post-operative nausea and vomiting        Allergies     Allergies   Allergen Reactions   . Bactrim [Sulfamethoxazole-Trimethoprim] Hives and Itching   . Cephalosporins Hives and Itching   . Ciprofloxacin Other (See Comments)     Severe joint pain   . Dye [Iodinated Diagnostic Agents] Itching and Other (See Comments)     Sneezing, itching   . Levaquin [Levofloxacin] Other (See Comments)     Severe joint pain   . Lipitor [Atorvastatin] Other (See Comments)     Muscle ache/spasms/cramps  Headache   . Lidocaine Palpitations       Medications     Current Outpatient Prescriptions on File Prior to Visit   Medication Sig Dispense Refill   . Acetaminophen-Codeine (TYLENOL/CODEINE #3) 300-30 MG per tablet Take 1 tablet by mouth every 4 (four) hours as needed for Pain. 10 tablet 0   . aspirin EC 81 MG EC tablet Take 81 mg by mouth daily.     . Cholecalciferol (VITAMIN D3) 50000 units Cap TAKE ONE CAPSULE BY MOUTH WEEKLY  2   . Lactobacillus Rhamnosus, GG, (CULTURELLE PO) Take 1 capsule by mouth daily.     Marland Kitchen LORazepam (ATIVAN) 0.5 MG tablet Take 0.5 mg by mouth nightly as  needed.     Marland Kitchen LORazepam (ATIVAN) 1 MG tablet Take 1 mg by mouth every 6 (six) hours as needed.Per pt takes at 3pm and 6pm every day     . Multiple Vitamins-Minerals (EMERGEN-C IMMUNE PO) Take by mouth daily as needed.     . traMADol (ULTRAM) 50 MG tablet TAKE 2 TABLETS BY MOUTH EVERY 8 HOURS   Takes BID  1     No current facility-administered medications on file prior to visit.        Review of Systems     Constitutional: Negative for fevers and chills  Skin: No rash or lesions  Respiratory: Negative for cough, wheezing, or hemoptysis  Cardiovascular: as per HPI  Gastrointestinal: Negative for abdominal pain, nausea, vomiting and diarrhea  Musculoskeletal:  No arthritic symptoms  Genitourinary: Negative for dysuria  All other systems were reviewed and are negative except what is stated in the HPI    Physical Exam     Vitals:    01/22/17 1150   BP: 134/85   Pulse: 85       Body mass index is  26.61 kg/m.    General: Patient appears their stated age, well-nourished. Alert and in no apparent distress.  Lungs: Respiratory effort unlabored, chest expansion symmetric.  Cardiac: RRR, no carotid bruits, no JVD.   Extremities warm,  Abd: Soft, nondistended, nontender. No guarding or rebound, No mid line pulsatile mass   ZOX:WRUE ROM in all 4 extremities, symmetric Well-healed right neck incision  Skin: Color appropriate for race, Skin warm, dry, no gangrene, no non healing ulcers, no varicose veins , no hyperpigmentation, no lipo-dermatosclerosis  Neuro: Good insight and judgment, oriented to person, place, and time CN II-XII intact, gross motor and sensory intact      Labs     CBC:   WBC   Date/Time Value Ref Range Status   06/12/2016 04:28 AM 9.75 3.50 - 10.80 x10 3/uL Final     RBC   Date/Time Value Ref Range Status   06/12/2016 04:28 AM 3.49 (L) 4.20 - 5.40 x10 6/uL Final     Hgb   Date/Time Value Ref Range Status   06/12/2016 04:28 AM 10.9 (L) 12.0 - 16.0 g/dL Final     Hematocrit   Date/Time Value Ref Range Status    06/12/2016 04:28 AM 32.4 (L) 37.0 - 47.0 % Final     MCV   Date/Time Value Ref Range Status   06/12/2016 04:28 AM 92.8 80.0 - 100.0 fL Final     MCHC   Date/Time Value Ref Range Status   06/12/2016 04:28 AM 33.6 32.0 - 36.0 g/dL Final     RDW   Date/Time Value Ref Range Status   06/12/2016 04:28 AM 12 12 - 15 % Final     Platelets   Date/Time Value Ref Range Status   06/12/2016 04:28 AM 209 140 - 400 x10 3/uL Final       CMP:   Sodium   Date/Time Value Ref Range Status   06/12/2016 04:28 AM 141 136 - 145 mEq/L Final     Potassium   Date/Time Value Ref Range Status   06/12/2016 04:28 AM 3.9 3.5 - 5.1 mEq/L Final     Chloride   Date/Time Value Ref Range Status   06/12/2016 04:28 AM 108 100 - 111 mEq/L Final     CO2   Date/Time Value Ref Range Status   06/12/2016 04:28 AM 25 22 - 29 mEq/L Final     Glucose   Date/Time Value Ref Range Status   06/12/2016 04:28 AM 111 (H) 70 - 100 mg/dL Final     Comment:     ADA guidelines for diabetes mellitus:  Fasting:  Equal to or greater than 126 mg/dL  Random:   Equal to or greater than 200 mg/dL       BUN   Date/Time Value Ref Range Status   06/12/2016 04:28 AM 12 7 - 19 mg/dL Final     Anion Gap   Date/Time Value Ref Range Status   06/12/2016 04:28 AM 8.0 5.0 - 15.0 Final       Lipid Panel No results found for: CHOL, TRIG, HDL    Coags: No results found for: PT, INR, PTT    Assessment and Plan       1. Carotid stenosis, asymptomatic, right  US Carotid Duplex Dopp Comp Bilateral       My impression is that her tinnitus is probably not secondary to carotid disease. The Carotid duplex ultrasound revealed patent right carotid artery endarterectomy with slight evidence of new intimal hyperplasia formation.  The left carotid artery has minimal disease.  Therefore, at the present time, I do not believe her tinnitus and hearing arterial pulsation is secondary to carotid disease.  She is concerned.  If she has cerebral aneurysm and I have offered her to obtain an MRA of intracranial  vasculature.  However, she has declined.  The above findings have been explained to the patient, and as per her request I will see her back in the office for follow-up duplex ultrasound examination in 3 months..  This was explained to the patient and has agreed.    [name], MD, FACS, RPVI  [facility]  Chief, Section of Vascular Surgery  [place]  [facility]

## 2017-02-05 ENCOUNTER — Other Ambulatory Visit (INDEPENDENT_AMBULATORY_CARE_PROVIDER_SITE_OTHER): Payer: BLUE CROSS/BLUE SHIELD

## 2017-04-30 ENCOUNTER — Ambulatory Visit (HOSPITAL_BASED_OUTPATIENT_CLINIC_OR_DEPARTMENT_OTHER): Payer: BLUE CROSS/BLUE SHIELD

## 2017-04-30 ENCOUNTER — Encounter (HOSPITAL_BASED_OUTPATIENT_CLINIC_OR_DEPARTMENT_OTHER): Payer: Self-pay | Admitting: Specialist

## 2017-04-30 ENCOUNTER — Ambulatory Visit (HOSPITAL_BASED_OUTPATIENT_CLINIC_OR_DEPARTMENT_OTHER): Payer: BLUE CROSS/BLUE SHIELD | Admitting: Specialist

## 2017-04-30 VITALS — BP 129/76 | HR 80 | Ht 68.0 in | Wt 175.0 lb

## 2017-04-30 DIAGNOSIS — I6521 Occlusion and stenosis of right carotid artery: Secondary | ICD-10-CM

## 2017-04-30 NOTE — Progress Notes (Signed)
[facility]    Chief Complaint   Patient presents with   . Follow-up     bilat carotid u/s         History of Present Illness     Audrey Gilmore is a 61 y.o. female who presents Status post right carotid artery endarterectomy January 2018.  Patient complains of pulsating, tinnitus.  She is neurologically completely intact.Patient has undergone follow-up carotid duplex ultrasound examination.    Past Medical History     Past Medical History:   Diagnosis Date   . Arthritis    . Bursitis    . Carotid stenosis, asymptomatic, right 05/16/2016   . Chronic pain     Follows w/Pain Management for many years   . Dysphagia    . Gastroesophageal reflux disease    . Hiatal hernia    . Low back pain    . Osteopenia    . Palpitations     Due to dehydration/increased caffeine intake   . Paroxysmal SVT (supraventricular tachycardia)     Last episode ~1992   . Post-operative nausea and vomiting        Allergies     Allergies   Allergen Reactions   . Bactrim [Sulfamethoxazole-Trimethoprim] Hives and Itching   . Cephalosporins Hives and Itching   . Ciprofloxacin Other (See Comments)     Severe joint pain   . Dye [Iodinated Diagnostic Agents] Itching and Other (See Comments)     Sneezing, itching   . Levaquin [Levofloxacin] Other (See Comments)     Severe joint pain   . Lipitor [Atorvastatin] Other (See Comments)     Muscle ache/spasms/cramps  Headache   . Lidocaine Palpitations       Medications     Current Outpatient Prescriptions on File Prior to Visit   Medication Sig Dispense Refill   . Acetaminophen-Codeine (TYLENOL/CODEINE #3) 300-30 MG per tablet Take 1 tablet by mouth every 4 (four) hours as needed for Pain. 10 tablet 0   . aspirin EC 81 MG EC tablet Take 81 mg by mouth daily.     . Cholecalciferol (VITAMIN D3) 50000 units Cap TAKE ONE CAPSULE BY MOUTH WEEKLY  2   . Lactobacillus Rhamnosus, GG, (CULTURELLE PO) Take 1 capsule by mouth daily.     Marland Kitchen LORazepam (ATIVAN) 0.5 MG tablet Take 0.5 mg by mouth nightly as  needed.     Marland Kitchen LORazepam (ATIVAN) 1 MG tablet Take 1 mg by mouth every 6 (six) hours as needed.Per pt takes at 3pm and 6pm every day     . Multiple Vitamins-Minerals (EMERGEN-C IMMUNE PO) Take by mouth daily as needed.     . traMADol (ULTRAM) 50 MG tablet TAKE 2 TABLETS BY MOUTH EVERY 8 HOURS   Takes BID  1     No current facility-administered medications on file prior to visit.        Review of Systems     Constitutional: Negative for fevers and chills  Skin: No rash or lesions  Respiratory: Negative for cough, wheezing, or hemoptysis  Cardiovascular: as per HPI  Gastrointestinal: Negative for abdominal pain, nausea, vomiting and diarrhea  Musculoskeletal:  No arthritic symptoms  Genitourinary: Negative for dysuria  All other systems were reviewed and are negative except what is stated in the HPI    Physical Exam     Vitals:    04/30/17 1200   BP: 129/76   Pulse: 80       Body mass index is  26.61 kg/m.    General: Patient appears their stated age, well-nourished. Alert and in no apparent distress.  Lungs: Respiratory effort unlabored, chest expansion symmetric.  Cardiac: RRR, no carotid bruits, no JVD.   Extremities warm,  Abd: Soft, nondistended, nontender. No guarding or rebound, No mid line pulsatile mass   MWU:XLKG ROM in all 4 extremities, symmetric Well-healed right neck incision  Skin: Color appropriate for race, Skin warm, dry, no gangrene, no non healing ulcers, no varicose veins , no hyperpigmentation, no lipo-dermatosclerosis  Neuro: Good insight and judgment, oriented to person, place, and time CN II-XII intact, gross motor and sensory intact      Labs     CBC:   WBC   Date/Time Value Ref Range Status   06/12/2016 04:28 AM 9.75 3.50 - 10.80 x10 3/uL Final     RBC   Date/Time Value Ref Range Status   06/12/2016 04:28 AM 3.49 (L) 4.20 - 5.40 x10 6/uL Final     Hgb   Date/Time Value Ref Range Status   06/12/2016 04:28 AM 10.9 (L) 12.0 - 16.0 g/dL Final     Hematocrit   Date/Time Value Ref Range Status    06/12/2016 04:28 AM 32.4 (L) 37.0 - 47.0 % Final     MCV   Date/Time Value Ref Range Status   06/12/2016 04:28 AM 92.8 80.0 - 100.0 fL Final     MCHC   Date/Time Value Ref Range Status   06/12/2016 04:28 AM 33.6 32.0 - 36.0 g/dL Final     RDW   Date/Time Value Ref Range Status   06/12/2016 04:28 AM 12 12 - 15 % Final     Platelets   Date/Time Value Ref Range Status   06/12/2016 04:28 AM 209 140 - 400 x10 3/uL Final       CMP:   Sodium   Date/Time Value Ref Range Status   06/12/2016 04:28 AM 141 136 - 145 mEq/L Final     Potassium   Date/Time Value Ref Range Status   06/12/2016 04:28 AM 3.9 3.5 - 5.1 mEq/L Final     Chloride   Date/Time Value Ref Range Status   06/12/2016 04:28 AM 108 100 - 111 mEq/L Final     CO2   Date/Time Value Ref Range Status   06/12/2016 04:28 AM 25 22 - 29 mEq/L Final     Glucose   Date/Time Value Ref Range Status   06/12/2016 04:28 AM 111 (H) 70 - 100 mg/dL Final     Comment:     ADA guidelines for diabetes mellitus:  Fasting:  Equal to or greater than 126 mg/dL  Random:   Equal to or greater than 200 mg/dL       BUN   Date/Time Value Ref Range Status   06/12/2016 04:28 AM 12 7 - 19 mg/dL Final     Anion Gap   Date/Time Value Ref Range Status   06/12/2016 04:28 AM 8.0 5.0 - 15.0 Final       Lipid Panel No results found for: CHOL, TRIG, HDL    Coags: No results found for: PT, INR, PTT    Assessment and Plan       1. Carotid stenosis, asymptomatic, right  US Carotid Duplex Dopp Comp Bilateral       My impression is that her tinnitus is probably not secondary to carotid disease. The Carotid duplex ultrasound revealed patent right carotid artery endarterectomy With resolution of the neointimal hyperplasia.  The left  carotid artery has minimal disease.  Therefore, at the present time,I will see her back in the office for follow-up duplex ultrasound examination in 6 months..  This was explained to the patient and has agreed.    [name], MD, FACS, RPVI  [facility]  Chief, Section of  Vascular Surgery  [place]  [facility]

## 2017-05-01 NOTE — Procedures (Signed)
Full report to be scanned into EPIC

## 2017-05-01 NOTE — Procedures (Signed)
PROCEDURE NOTE    [facility]    Procedures    Electronically Signed: Keirstin Musil J Chasiti Waddington

## 2017-05-21 ENCOUNTER — Other Ambulatory Visit (INDEPENDENT_AMBULATORY_CARE_PROVIDER_SITE_OTHER): Payer: Self-pay | Admitting: Specialist

## 2017-05-21 DIAGNOSIS — I6521 Occlusion and stenosis of right carotid artery: Secondary | ICD-10-CM

## 2017-08-14 ENCOUNTER — Encounter (INDEPENDENT_AMBULATORY_CARE_PROVIDER_SITE_OTHER): Payer: Self-pay | Admitting: Specialist

## 2017-08-14 ENCOUNTER — Ambulatory Visit (INDEPENDENT_AMBULATORY_CARE_PROVIDER_SITE_OTHER): Payer: BLUE CROSS/BLUE SHIELD | Admitting: Specialist

## 2017-08-14 ENCOUNTER — Ambulatory Visit (INDEPENDENT_AMBULATORY_CARE_PROVIDER_SITE_OTHER): Payer: BLUE CROSS/BLUE SHIELD

## 2017-08-14 VITALS — BP 124/70 | HR 70 | Ht 68.0 in | Wt 174.0 lb

## 2017-08-14 DIAGNOSIS — I6521 Occlusion and stenosis of right carotid artery: Secondary | ICD-10-CM

## 2017-08-14 NOTE — Procedures (Signed)
Full report will follow into chart

## 2017-08-14 NOTE — Progress Notes (Signed)
[facility]    Chief Complaint   Patient presents with   . Follow-up     f/u to right carotid stenosis          History of Present Illness     Audrey Gilmore is a 62 y.o. female who presents Status post right carotid artery endarterectomy January 2018.  Patient complains of Some dizziness.  She is neurologically completely intact.Patient has undergone follow-up carotid duplex ultrasound examination.  She also complains of irregular heartbeat which is being worked up by her cardiologist    Past Medical History     Past Medical History:   Diagnosis Date   . Arthritis    . Bursitis    . Carotid stenosis, asymptomatic, right 05/16/2016   . Chronic pain     Follows w/Pain Management for many years   . Dysphagia    . Gastroesophageal reflux disease    . Hiatal hernia    . Low back pain    . Osteopenia    . Palpitations     Due to dehydration/increased caffeine intake   . Paroxysmal SVT (supraventricular tachycardia)     Last episode ~1992   . Post-operative nausea and vomiting        Allergies     Allergies   Allergen Reactions   . Bactrim [Sulfamethoxazole-Trimethoprim] Hives and Itching   . Cephalosporins Hives and Itching   . Ciprofloxacin Other (See Comments)     Severe joint pain   . Dye [Iodinated Diagnostic Agents] Itching and Other (See Comments)     Sneezing, itching   . Levaquin [Levofloxacin] Other (See Comments)     Severe joint pain   . Lipitor [Atorvastatin] Other (See Comments)     Muscle ache/spasms/cramps  Headache   . Lidocaine Palpitations       Medications     Current Outpatient Prescriptions on File Prior to Visit   Medication Sig Dispense Refill   . aspirin EC 81 MG EC tablet Take 81 mg by mouth daily.     . Cholecalciferol (VITAMIN D3) 50000 units Cap TAKE ONE CAPSULE BY MOUTH WEEKLY  2   . Lactobacillus Rhamnosus, GG, (CULTURELLE PO) Take 1 capsule by mouth daily.     Marland Kitchen LORazepam (ATIVAN) 0.5 MG tablet Take 0.5 mg by mouth nightly as needed.     . traMADol (ULTRAM) 50 MG tablet TAKE 2  TABLETS BY MOUTH EVERY 8 HOURS   Takes BID  1   . [DISCONTINUED] Acetaminophen-Codeine (TYLENOL/CODEINE #3) 300-30 MG per tablet Take 1 tablet by mouth every 4 (four) hours as needed for Pain. 10 tablet 0   . [DISCONTINUED] LORazepam (ATIVAN) 1 MG tablet Take 1 mg by mouth every 6 (six) hours as needed.Per pt takes at 3pm and 6pm every day     . [DISCONTINUED] Multiple Vitamins-Minerals (EMERGEN-C IMMUNE PO) Take by mouth daily as needed.       No current facility-administered medications on file prior to visit.        Review of Systems     Constitutional: Negative for fevers and chills  Skin: No rash or lesions  Respiratory: Negative for cough, wheezing, or hemoptysis  Cardiovascular: as per HPI  Gastrointestinal: Negative for abdominal pain, nausea, vomiting and diarrhea  Musculoskeletal:  No arthritic symptoms  Genitourinary: Negative for dysuria  All other systems were reviewed and are negative except what is stated in the HPI    Physical Exam     Vitals:  08/14/17 1505   BP: 124/70   Pulse: 70       Body mass index is 26.46 kg/m.    General: Patient appears their stated age, well-nourished. Alert and in no apparent distress.  Lungs: Respiratory effort unlabored, chest expansion symmetric.  Cardiac: RRR, no carotid bruits, no JVD.   Extremities warm,  Abd: Soft, nondistended, nontender. No guarding or rebound, No mid line pulsatile mass   NWG:NFAO ROM in all 4 extremities, symmetric Well-healed right neck incision  Skin: Color appropriate for race, Skin warm, dry, no gangrene, no non healing ulcers, no varicose veins , no hyperpigmentation, no lipo-dermatosclerosis  Neuro: Good insight and judgment, oriented to person, place, and time CN II-XII intact, gross motor and sensory intact      Labs     CBC:   WBC   Date/Time Value Ref Range Status   06/12/2016 04:28 AM 9.75 3.50 - 10.80 x10 3/uL Final     RBC   Date/Time Value Ref Range Status   06/12/2016 04:28 AM 3.49 (L) 4.20 - 5.40 x10 6/uL Final     Hgb    Date/Time Value Ref Range Status   06/12/2016 04:28 AM 10.9 (L) 12.0 - 16.0 g/dL Final     Hematocrit   Date/Time Value Ref Range Status   06/12/2016 04:28 AM 32.4 (L) 37.0 - 47.0 % Final     MCV   Date/Time Value Ref Range Status   06/12/2016 04:28 AM 92.8 80.0 - 100.0 fL Final     MCHC   Date/Time Value Ref Range Status   06/12/2016 04:28 AM 33.6 32.0 - 36.0 g/dL Final     RDW   Date/Time Value Ref Range Status   06/12/2016 04:28 AM 12 12 - 15 % Final     Platelets   Date/Time Value Ref Range Status   06/12/2016 04:28 AM 209 140 - 400 x10 3/uL Final       CMP:   Sodium   Date/Time Value Ref Range Status   06/12/2016 04:28 AM 141 136 - 145 mEq/L Final     Potassium   Date/Time Value Ref Range Status   06/12/2016 04:28 AM 3.9 3.5 - 5.1 mEq/L Final     Chloride   Date/Time Value Ref Range Status   06/12/2016 04:28 AM 108 100 - 111 mEq/L Final     CO2   Date/Time Value Ref Range Status   06/12/2016 04:28 AM 25 22 - 29 mEq/L Final     Glucose   Date/Time Value Ref Range Status   06/12/2016 04:28 AM 111 (H) 70 - 100 mg/dL Final     Comment:     ADA guidelines for diabetes mellitus:  Fasting:  Equal to or greater than 126 mg/dL  Random:   Equal to or greater than 200 mg/dL       BUN   Date/Time Value Ref Range Status   06/12/2016 04:28 AM 12 7 - 19 mg/dL Final     Anion Gap   Date/Time Value Ref Range Status   06/12/2016 04:28 AM 8.0 5.0 - 15.0 Final       Lipid Panel No results found for: CHOL, TRIG, HDL    Coags: No results found for: PT, INR, PTT    Assessment and Plan       1. Carotid stenosis, asymptomatic, right  US Carotid Duplex Dopp Comp Bilateral       My impression is that her Dizziness is probably not secondary to carotid  disease. The Carotid duplex ultrasound revealed patent right carotid artery endarterectomy With resolution of the neointimal hyperplasia.  The left carotid artery has minimal disease.  Therefore, at the present time,I will see her back in the office for follow-up duplex ultrasound  examination in 6 months..  This was explained to the patient and has agreed.    [name], MD, FACS, RPVI  [facility]  Chief, Section of Vascular Surgery    [facility]

## 2017-10-30 ENCOUNTER — Ambulatory Visit (INDEPENDENT_AMBULATORY_CARE_PROVIDER_SITE_OTHER): Payer: BLUE CROSS/BLUE SHIELD | Admitting: Specialist

## 2017-10-30 ENCOUNTER — Other Ambulatory Visit (INDEPENDENT_AMBULATORY_CARE_PROVIDER_SITE_OTHER): Payer: BLUE CROSS/BLUE SHIELD

## 2018-02-06 ENCOUNTER — Encounter (INDEPENDENT_AMBULATORY_CARE_PROVIDER_SITE_OTHER): Payer: Self-pay

## 2018-02-06 NOTE — Progress Notes (Signed)
:  Room        VASCULAR SURGERY PATIENT FORM       Appt Date/Time: 10/2 330pm   PCP: Ledora Bottcher, MD  Referring Physician:    New Patient Follow-Up x Post-op   [place] Today @ 3pm [place] [facility]     Chief Complaint/HPI: 6 MON F/U- RIGHT CAROTID STENOSIS Date of Last Visit: 08/14/17   Allergies   Allergen Reactions   . Bactrim [Sulfamethoxazole-Trimethoprim] Hives and Itching   . Cephalosporins Hives and Itching   . Ciprofloxacin Other (See Comments)     Severe joint pain   . Dye [Iodinated Diagnostic Agents] Itching and Other (See Comments)     Sneezing, itching   . Levaquin [Levofloxacin] Other (See Comments)     Severe joint pain   . Lipitor [Atorvastatin] Other (See Comments)     Muscle ache/spasms/cramps  Headache   . Lidocaine Palpitations        Selected Medications:    Coumadin Plavix Eliquis Xarelto Aspirin Fish Oil Lovenox   Insulin Metformin Other Diabetes Atorvastatin  Other Cholesterol    MHx:  Stroke/TIA/Seizures Thyroid  Diabetes   Myocardial Infarction Arrhythmia  COPD/Asthma (other lung)   Kidney Disease Liver Disease DVT   Wounds Musculoskeletal (spine) Cancer             Smoker           Former Smoker          Never Smoker    Vital Signs this Visit Pulses  HT WT HR   Rad Ulnar Brach Fem Pop DP PT       L          BP L BP R SpO2  R                        Imaging results:           Plan of Care:

## 2018-02-12 ENCOUNTER — Ambulatory Visit (INDEPENDENT_AMBULATORY_CARE_PROVIDER_SITE_OTHER): Payer: BLUE CROSS/BLUE SHIELD

## 2018-02-12 ENCOUNTER — Ambulatory Visit (INDEPENDENT_AMBULATORY_CARE_PROVIDER_SITE_OTHER): Payer: BLUE CROSS/BLUE SHIELD | Admitting: Specialist

## 2018-02-12 VITALS — BP 159/77 | HR 79 | Temp 98.2°F | Ht 68.0 in | Wt 178.0 lb

## 2018-02-12 DIAGNOSIS — I6521 Occlusion and stenosis of right carotid artery: Secondary | ICD-10-CM

## 2018-02-12 DIAGNOSIS — I6522 Occlusion and stenosis of left carotid artery: Secondary | ICD-10-CM

## 2018-02-12 NOTE — Progress Notes (Signed)
[facility]    Chief Complaint   Patient presents with   . Other     57yr 59m f/u Rt Carotid Stenosis. Pt is experiencing tenderness in incision area.         History of Present Illness     Audrey Gilmore is a 63 y.o. female who presents Status post right carotid artery endarterectomy January 2018.  Patient complains of Some dizziness.  She is neurologically completely intact.Patient has undergone follow-up carotid duplex ultrasound examination.  She also complains of irregular heartbeat which is being worked up by her cardiologist    Past Medical History     Past Medical History:   Diagnosis Date   . Arthritis    . Bursitis    . Carotid stenosis, asymptomatic, right 05/16/2016   . Chronic pain     Follows w/Pain Management for many years   . Dysphagia    . Gastroesophageal reflux disease    . Hiatal hernia    . Low back pain    . Osteopenia    . Palpitations     Due to dehydration/increased caffeine intake   . Paroxysmal SVT (supraventricular tachycardia)     Last episode ~1992   . Post-operative nausea and vomiting        Allergies     Allergies   Allergen Reactions   . Bactrim [Sulfamethoxazole-Trimethoprim] Hives and Itching   . Cephalosporins Hives and Itching   . Ciprofloxacin Other (See Comments)     Severe joint pain   . Dye [Iodinated Diagnostic Agents] Itching and Other (See Comments)     Sneezing, itching   . Levaquin [Levofloxacin] Other (See Comments)     Severe joint pain   . Lipitor [Atorvastatin] Other (See Comments)     Muscle ache/spasms/cramps  Headache   . Lidocaine Palpitations       Medications     Current Outpatient Medications on File Prior to Visit   Medication Sig Dispense Refill   . aspirin EC 81 MG EC tablet Take 81 mg by mouth daily.     . Cholecalciferol (VITAMIN D3) 50000 units Cap TAKE ONE CAPSULE BY MOUTH WEEKLY  2   . co-enzyme Q-10 30 MG capsule Take 30 mg by mouth 3 (three) times daily     . Lactobacillus Rhamnosus, GG, (CULTURELLE PO) Take 1 capsule by mouth daily.      Marland Kitchen LORazepam (ATIVAN) 0.5 MG tablet Take 0.5 mg by mouth nightly as needed.     . traMADol (ULTRAM) 50 MG tablet TAKE 2 TABLETS BY MOUTH EVERY 8 HOURS   Takes BID  1   . [DISCONTINUED] atenolol (TENORMIN) 25 MG tablet Take 12.5 mg by mouth daily      3     No current facility-administered medications on file prior to visit.        Review of Systems     Constitutional: Negative for fevers and chills  Skin: No rash or lesions  Respiratory: Negative for cough, wheezing, or hemoptysis  Cardiovascular: as per HPI  Gastrointestinal: Negative for abdominal pain, nausea, vomiting and diarrhea  Musculoskeletal:  No arthritic symptoms  Genitourinary: Negative for dysuria  All other systems were reviewed and are negative except what is stated in the HPI    Physical Exam     Vitals:    02/12/18 1624   BP: 159/77   Pulse: 79   Temp: 98.2 F (36.8 C)   SpO2: 97%  Body mass index is 27.06 kg/m.    General: Patient appears their stated age, well-nourished. Alert and in no apparent distress.  Lungs: Respiratory effort unlabored, chest expansion symmetric.  Cardiac: RRR, no carotid bruits, no JVD.   Extremities warm,  Abd: Soft, nondistended, nontender. No guarding or rebound, No mid line pulsatile mass   WUX:LKGM ROM in all 4 extremities, symmetric Well-healed right neck incision  Skin: Color appropriate for race, Skin warm, dry, no gangrene, no non healing ulcers, no varicose veins , no hyperpigmentation, no lipo-dermatosclerosis  Neuro: Good insight and judgment, oriented to person, place, and time CN II-XII intact, gross motor and sensory intact      Labs     CBC:   WBC   Date/Time Value Ref Range Status   06/12/2016 04:28 AM 9.75 3.50 - 10.80 x10 3/uL Final     RBC   Date/Time Value Ref Range Status   06/12/2016 04:28 AM 3.49 (L) 4.20 - 5.40 x10 6/uL Final     Hgb   Date/Time Value Ref Range Status   06/12/2016 04:28 AM 10.9 (L) 12.0 - 16.0 g/dL Final     Hematocrit   Date/Time Value Ref Range Status   06/12/2016  04:28 AM 32.4 (L) 37.0 - 47.0 % Final     MCV   Date/Time Value Ref Range Status   06/12/2016 04:28 AM 92.8 80.0 - 100.0 fL Final     MCHC   Date/Time Value Ref Range Status   06/12/2016 04:28 AM 33.6 32.0 - 36.0 g/dL Final     RDW   Date/Time Value Ref Range Status   06/12/2016 04:28 AM 12 12 - 15 % Final     Platelets   Date/Time Value Ref Range Status   06/12/2016 04:28 AM 209 140 - 400 x10 3/uL Final       CMP:   Sodium   Date/Time Value Ref Range Status   06/12/2016 04:28 AM 141 136 - 145 mEq/L Final     Potassium   Date/Time Value Ref Range Status   06/12/2016 04:28 AM 3.9 3.5 - 5.1 mEq/L Final     Chloride   Date/Time Value Ref Range Status   06/12/2016 04:28 AM 108 100 - 111 mEq/L Final     CO2   Date/Time Value Ref Range Status   06/12/2016 04:28 AM 25 22 - 29 mEq/L Final     Glucose   Date/Time Value Ref Range Status   06/12/2016 04:28 AM 111 (H) 70 - 100 mg/dL Final     Comment:     ADA guidelines for diabetes mellitus:  Fasting:  Equal to or greater than 126 mg/dL  Random:   Equal to or greater than 200 mg/dL       BUN   Date/Time Value Ref Range Status   06/12/2016 04:28 AM 12 7 - 19 mg/dL Final     Anion Gap   Date/Time Value Ref Range Status   06/12/2016 04:28 AM 8.0 5.0 - 15.0 Final       Lipid Panel No results found for: CHOL, TRIG, HDL    Coags: No results found for: PT, INR, PTT    Assessment and Plan       1. Asymptomatic stenosis of left carotid artery  US Carotid Duplex Dopp Comp Bilateral   2. Carotid stenosis, asymptomatic, right         My impression is that her Dizziness is probably not secondary to carotid disease. The Carotid duplex ultrasound  revealed patent right carotid artery endarterectomy With resolution of the neointimal hyperplasia.  The left carotid artery has minimal disease.  Therefore, at the present time,I will see her back in the office for follow-up duplex ultrasound examination in 6 months..  This was explained to the patient and has agreed.    [name], MD, FACS,  RPVI  [facility]  Chief, Section of Vascular Surgery  [place]  [facility]

## 2018-10-03 IMAGING — DX DG CHEST 2V
2 series · 2 of 2 positions shown · non-contrast
Comparison: None.

CLINICAL DATA: 61 y/o  F; palpitations.

EXAM:
CHEST  2 VIEW

[w chest pa]
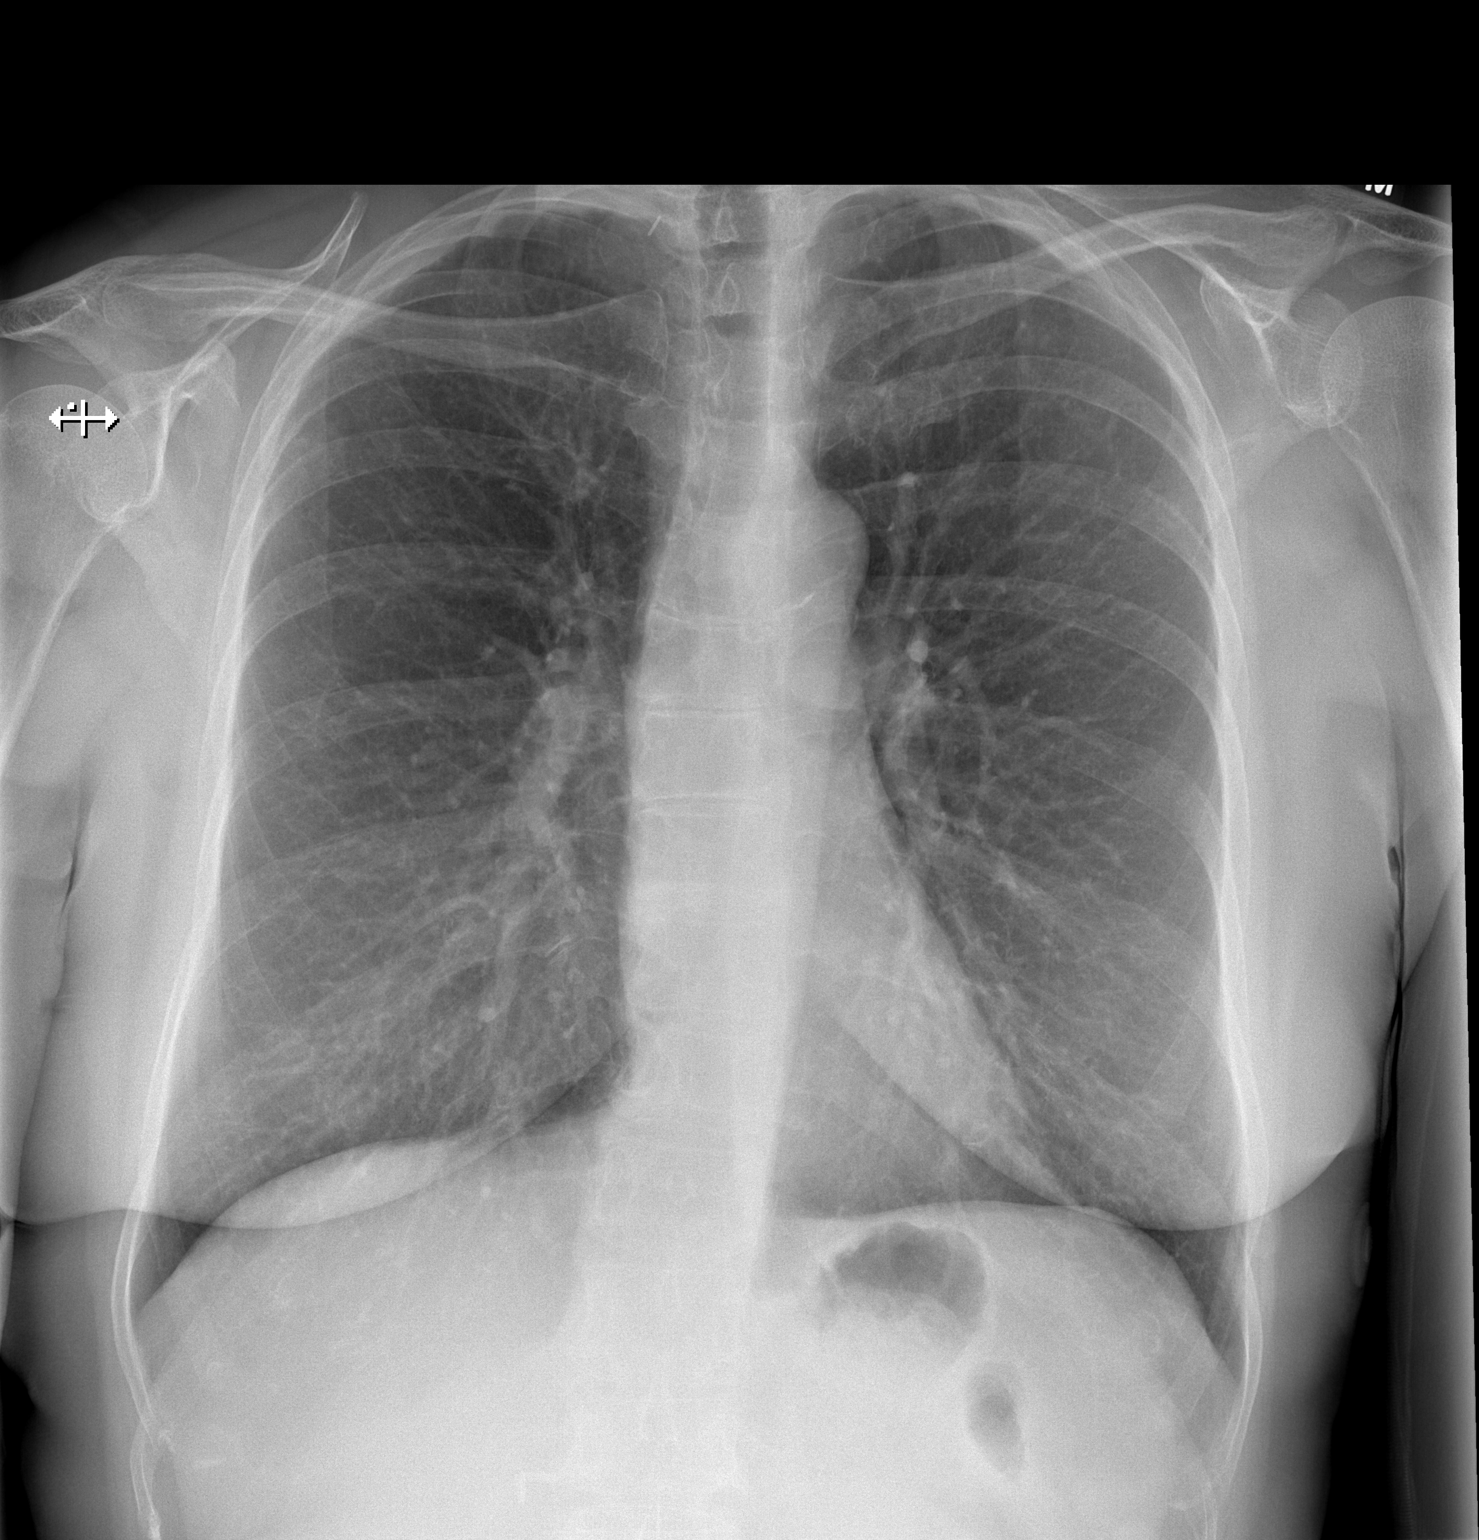

[w chest lat]
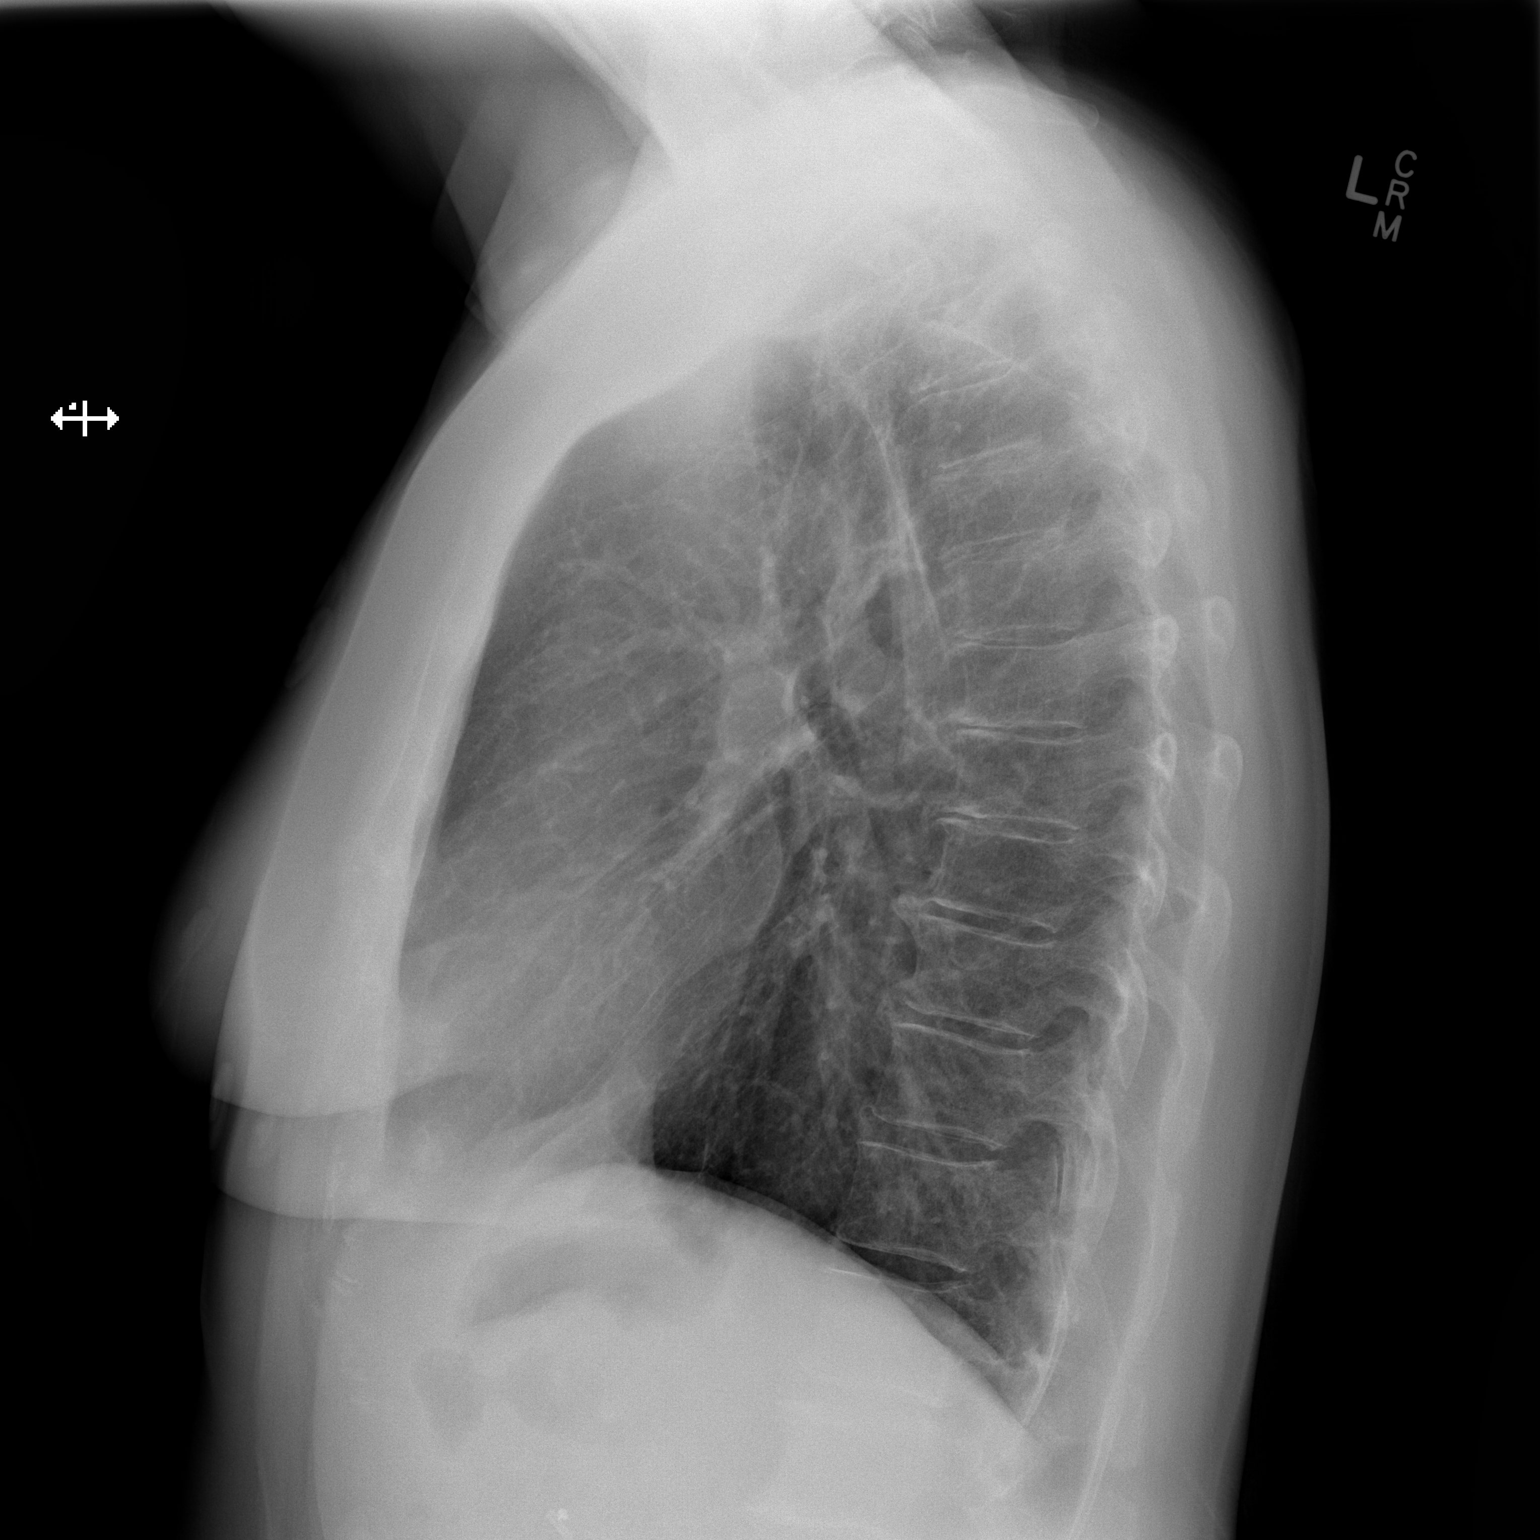

[2 of 2 positions shown; findings below may reference images not displayed]

FINDINGS: Normal cardiac silhouette. Aortic atherosclerosis with
calcification. Clear lungs. No pleural effusion or pneumothorax.
Bones are unremarkable. Surgical clips project over the right lower
neck. Right upper quadrant surgical clips, presumably
cholecystectomy.
IMPRESSION: No active cardiopulmonary disease.

By: Kok Peng Hamidi M.D.

## 2019-07-15 ENCOUNTER — Encounter (INDEPENDENT_AMBULATORY_CARE_PROVIDER_SITE_OTHER): Payer: Self-pay

## 2019-07-15 NOTE — Progress Notes (Signed)
64 y.o. female [phone]    Appt Date/Time:  07-16-19 1010  PCP: Ledora Bottcher, MD  Referring Physician:    New_Patient Follow_Up Post_op   US_Today 0900 BIL CAROTID US_[facility] Other_Imaging     Chief Complaint/HPI:  ASYMPTOMATIC BIL CAROTID STENOSIS  Date of Last Visit:  02-12-2018  Last Visit Recommendations:The Carotid duplex ultrasound revealed patent right carotid artery endarterectomy With resolution of the neointimal hyperplasia.  The left carotid artery has minimal disease.  Therefore, at the present time,I will see her back in the office for follow-up duplex ultrasound examination in 6 months.Marland Kitchen    HxSx: 06-11-2016 R CEA    Allergies   Allergen Reactions   . Bactrim [Sulfamethoxazole-Trimethoprim] Hives and Itching   . Cephalosporins Hives and Itching   . Ciprofloxacin Other (See Comments)     Severe joint pain   . Dye [Iodinated Diagnostic Agents] Itching and Other (See Comments)     Sneezing, itching   . Levaquin [Levofloxacin] Other (See Comments)     Severe joint pain   . Lipitor [Atorvastatin] Other (See Comments)     Muscle ache/spasms/cramps  Headache   . Lidocaine Palpitations        Selected Medications:    Coumadin Xarelto Eliquis Pradaxa Lovenox Other thinner:    Plavix Aspirin Brillinta Effient  Pletal Trental Fish_Oil   Insulin Metformin Other Diabetes Atorvastatin Rosuvastatin Other Chol:    MHx:  Stroke / TIA / Seizures HTN / HLD  Diabetes   MI / CAD A-fib / Arrhythmia  COPD / Asthma / (other lung)   Kidney_Disease Liver_Disease DVT / Coagulopathy   Wounds Spine / other MSK  Cancer            Smoker           Former Smoker          Never Smoker    Vital Signs this Visit Pulses  HT BP R BP L TEMP   Rad Ulnar Brach Fem Pop DP PT          R          Wt HR R HR L SpO2     L          Imaging results:        Plan of Care:

## 2019-07-16 ENCOUNTER — Ambulatory Visit (INDEPENDENT_AMBULATORY_CARE_PROVIDER_SITE_OTHER): Payer: Medicare Other

## 2019-07-16 ENCOUNTER — Ambulatory Visit (INDEPENDENT_AMBULATORY_CARE_PROVIDER_SITE_OTHER): Payer: Medicare Other | Admitting: Specialist

## 2019-07-16 ENCOUNTER — Encounter (INDEPENDENT_AMBULATORY_CARE_PROVIDER_SITE_OTHER): Payer: Self-pay

## 2019-07-16 VITALS — BP 147/83 | HR 81 | Temp 98.2°F | Ht 66.0 in | Wt 177.0 lb

## 2019-07-16 DIAGNOSIS — I6521 Occlusion and stenosis of right carotid artery: Secondary | ICD-10-CM

## 2019-07-16 DIAGNOSIS — I6522 Occlusion and stenosis of left carotid artery: Secondary | ICD-10-CM

## 2019-07-16 NOTE — Progress Notes (Signed)
[facility]    No chief complaint on file.        History of Present Illness     Audrey Gilmore is a 64 y.o. female who presents Status post right carotid artery endarterectomy January 2018.  She is neurologically completely intact.Patient has undergone follow-up carotid duplex ultrasound examination.  She also complains of irregular heartbeat which is being worked up by her cardiologist    Past Medical History     Past Medical History:   Diagnosis Date   . Arthritis    . Bursitis    . Carotid stenosis, asymptomatic, right 05/16/2016   . Chronic pain     Follows w/Pain Management for many years   . Dysphagia    . Gastroesophageal reflux disease    . Hiatal hernia    . Low back pain    . Osteopenia    . Palpitations     Due to dehydration/increased caffeine intake   . Paroxysmal SVT (supraventricular tachycardia)     Last episode ~1992   . Post-operative nausea and vomiting        Allergies     Allergies   Allergen Reactions   . Bactrim [Sulfamethoxazole-Trimethoprim] Hives and Itching   . Cephalosporins Hives and Itching   . Ciprofloxacin Other (See Comments)     Severe joint pain   . Dye [Iodinated Diagnostic Agents] Itching and Other (See Comments)     Sneezing, itching   . Levaquin [Levofloxacin] Other (See Comments)     Severe joint pain   . Lipitor [Atorvastatin] Other (See Comments)     Muscle ache/spasms/cramps  Headache   . Lidocaine Palpitations       Medications     Current Outpatient Medications on File Prior to Visit   Medication Sig Dispense Refill   . aspirin EC 81 MG EC tablet Take 81 mg by mouth daily.     . Cholecalciferol (VITAMIN D3) 50000 units Cap TAKE ONE CAPSULE BY MOUTH WEEKLY  2   . co-enzyme Q-10 30 MG capsule Take 30 mg by mouth 3 (three) times daily     . Lactobacillus Rhamnosus, GG, (CULTURELLE PO) Take 1 capsule by mouth daily.     Marland Kitchen LORazepam (ATIVAN) 0.5 MG tablet Take 0.5 mg by mouth nightly as needed.     . traMADol (ULTRAM) 50 MG tablet TAKE 2 TABLETS BY MOUTH EVERY 8  HOURS   Takes BID  1     No current facility-administered medications on file prior to visit.        Review of Systems     Constitutional: Negative for fevers and chills  Skin: No rash or lesions  Respiratory: Negative for cough, wheezing, or hemoptysis  Cardiovascular: as per HPI  Gastrointestinal: Negative for abdominal pain, nausea, vomiting and diarrhea  Musculoskeletal:  No arthritic symptoms  Genitourinary: Negative for dysuria  All other systems were reviewed and are negative except what is stated in the HPI    Physical Exam     There were no vitals filed for this visit.    There is no height or weight on file to calculate BMI.    General: Patient appears their stated age, well-nourished. Alert and in no apparent distress.  Lungs: Respiratory effort unlabored, chest expansion symmetric.  Cardiac: RRR, no carotid bruits, no JVD.   Extremities warm,  Abd: Soft, nondistended, nontender. No guarding or rebound, No mid line pulsatile mass   JXB:JYNW ROM in all 4 extremities, symmetric  Well-healed right neck incision  Skin: Color appropriate for race, Skin warm, dry, no gangrene, no non healing ulcers, no varicose veins , no hyperpigmentation, no lipo-dermatosclerosis  Neuro: Good insight and judgment, oriented to person, place, and time CN II-XII intact, gross motor and sensory intact      Labs     CBC:   WBC   Date/Time Value Ref Range Status   06/12/2016 04:28 AM 9.75 3.50 - 10.80 x10 3/uL Final     RBC   Date/Time Value Ref Range Status   06/12/2016 04:28 AM 3.49 (L) 4.20 - 5.40 x10 6/uL Final     Hgb   Date/Time Value Ref Range Status   06/12/2016 04:28 AM 10.9 (L) 12.0 - 16.0 g/dL Final     Hematocrit   Date/Time Value Ref Range Status   06/12/2016 04:28 AM 32.4 (L) 37.0 - 47.0 % Final     MCV   Date/Time Value Ref Range Status   06/12/2016 04:28 AM 92.8 80.0 - 100.0 fL Final     MCHC   Date/Time Value Ref Range Status   06/12/2016 04:28 AM 33.6 32.0 - 36.0 g/dL Final     RDW   Date/Time Value Ref Range  Status   06/12/2016 04:28 AM 12 12 - 15 % Final     Platelets   Date/Time Value Ref Range Status   06/12/2016 04:28 AM 209 140 - 400 x10 3/uL Final       CMP:   Sodium   Date/Time Value Ref Range Status   06/12/2016 04:28 AM 141 136 - 145 mEq/L Final     Potassium   Date/Time Value Ref Range Status   06/12/2016 04:28 AM 3.9 3.5 - 5.1 mEq/L Final     Chloride   Date/Time Value Ref Range Status   06/12/2016 04:28 AM 108 100 - 111 mEq/L Final     CO2   Date/Time Value Ref Range Status   06/12/2016 04:28 AM 25 22 - 29 mEq/L Final     Glucose   Date/Time Value Ref Range Status   06/12/2016 04:28 AM 111 (H) 70 - 100 mg/dL Final     Comment:     ADA guidelines for diabetes mellitus:  Fasting:  Equal to or greater than 126 mg/dL  Random:   Equal to or greater than 200 mg/dL       BUN   Date/Time Value Ref Range Status   06/12/2016 04:28 AM 12 7 - 19 mg/dL Final     Anion Gap   Date/Time Value Ref Range Status   06/12/2016 04:28 AM 8.0 5.0 - 15.0 Final       Lipid Panel No results found for: CHOL, TRIG, HDL    Coags: No results found for: PT, INR, PTT    Assessment and Plan       1. Carotid stenosis, asymptomatic, right  US Carotid Duplex Dopp Comp Bilateral       My impression is that her Dizziness is probably not secondary to carotid disease. The Carotid duplex ultrasound revealed patent right carotid artery endarterectomy With resolution of the neointimal hyperplasia.  The left carotid artery has minimal disease.  Therefore, at the present time,I will see her back in the office for follow-up duplex ultrasound examination in 12 months..  This was explained to the patient and has agreed.    [name], MD, FACS, RPVI  [facility]  Chief, Section of Vascular Surgery  [place]  [facility]

## 2020-06-27 ENCOUNTER — Encounter (INDEPENDENT_AMBULATORY_CARE_PROVIDER_SITE_OTHER): Payer: Self-pay

## 2020-06-27 NOTE — Progress Notes (Signed)
Room:         Appt Date/Time: 06/29/20@100     65 y.o. female   PCP: Ledora Bottcher, MD  Referring Physician:     New Patient Follow-Up Post-op   [place] Today  Carotid duplex@1230  [place] - [facility] Other Imaging     Chief Complaint/HPI: Patient having pain and swelling in neck, review u/s done today  Date of Last Visit:  07/16/19    Allergies:   Allergies   Allergen Reactions   . Bactrim [Sulfamethoxazole-Trimethoprim] Hives and Itching   . Cephalosporins Hives and Itching   . Ciprofloxacin Other (See Comments)     Severe joint pain   . Dye [Iodinated Diagnostic Agents] Itching and Other (See Comments)     Sneezing, itching   . Levaquin [Levofloxacin] Other (See Comments)     Severe joint pain   . Lipitor [Atorvastatin] Other (See Comments)     Muscle ache/spasms/cramps  Headache   . Lidocaine Palpitations       Selected Medications:    Coumadin Xarelto Eliquis Pradaxa Lovenox Other thinner:    Plavix Aspirin Brillinta Effient  Pletal Trental Fish Oil   Insulin Metformin Other Diabetes Atorvastatin Rosuvastatin Other Chol:    MHx:  Stroke / TIA / Seizures HTN / HLD  Diabetes   MI / CAD A-fib / Arrhythmia  COPD / Asthma / (other lung)   Kidney Disease Liver Disease DVT / Coagulopathy   Wounds Spine / other MSK  Cancer            Smoker       x    Former Smoker          Never Smoker    Vital Signs this Visit Pulses  HT  BP R BP L Temp   Rad Ulnar Brach Fem Pop DP PT        R          WT  Pulse R Pulse L SpO2       L          Imaging results:        Plan of Care:

## 2020-06-29 ENCOUNTER — Ambulatory Visit (INDEPENDENT_AMBULATORY_CARE_PROVIDER_SITE_OTHER): Payer: Medicare Other

## 2020-06-29 ENCOUNTER — Ambulatory Visit (INDEPENDENT_AMBULATORY_CARE_PROVIDER_SITE_OTHER): Payer: Medicare Other | Admitting: Specialist

## 2020-06-29 ENCOUNTER — Encounter (INDEPENDENT_AMBULATORY_CARE_PROVIDER_SITE_OTHER): Payer: Self-pay | Admitting: Specialist

## 2020-06-29 VITALS — BP 157/76 | HR 84 | Ht 66.0 in | Wt 177.0 lb

## 2020-06-29 DIAGNOSIS — I6521 Occlusion and stenosis of right carotid artery: Secondary | ICD-10-CM

## 2020-06-29 NOTE — Progress Notes (Signed)
[facility]    Chief Complaint   Patient presents with   . Other     NECK PAIN/SWELLING          History of Present Illness     Audrey Gilmore is a 65 y.o. female who presents for routine follow-up of right carotid artery endarterectomy which was performed many years ago.  At the present time she does not have any significant complaints.  Patient has undergone follow-up duplex ultrasound.      Past Medical History     Past Medical History:   Diagnosis Date   . Arthritis    . Bursitis    . Carotid stenosis, asymptomatic, right 05/16/2016   . Chronic pain     Follows w/Pain Management for many years   . Dysphagia    . Gastroesophageal reflux disease    . Hiatal hernia    . Low back pain    . Osteopenia    . Palpitations     Due to dehydration/increased caffeine intake   . Paroxysmal SVT (supraventricular tachycardia)     Last episode ~1992   . Post-operative nausea and vomiting        Allergies     Allergies   Allergen Reactions   . Bactrim [Sulfamethoxazole-Trimethoprim] Hives and Itching   . Cephalosporins Hives and Itching   . Ciprofloxacin Other (See Comments)     Severe joint pain   . Dye [Iodinated Diagnostic Agents] Itching and Other (See Comments)     Sneezing, itching   . Levaquin [Levofloxacin] Other (See Comments)     Severe joint pain   . Lipitor [Atorvastatin] Other (See Comments)     Muscle ache/spasms/cramps  Headache   . Lidocaine Palpitations       Medications     Current Outpatient Medications on File Prior to Visit   Medication Sig Dispense Refill   . aspirin EC 81 MG EC tablet Take 81 mg by mouth daily.     . Cholecalciferol (VITAMIN D3) 50000 units Cap TAKE ONE CAPSULE BY MOUTH WEEKLY  2   . co-enzyme Q-10 30 MG capsule Take 30 mg by mouth 3 (three) times daily     . Lactobacillus Rhamnosus, GG, (CULTURELLE PO) Take 1 capsule by mouth daily.     Marland Kitchen LORazepam (ATIVAN) 0.5 MG tablet Take 0.5 mg by mouth nightly as needed.     . Multiple Vitamins-Minerals (multivitamin with minerals)  tablet Take 1 tablet by mouth daily     . Probiotic Product (Acidophilus/Goat Milk) Cap Take 1 capsule by mouth     . traMADol (ULTRAM) 50 MG tablet TAKE 2 TABLETS BY MOUTH EVERY 8 HOURS   Takes BID  1     No current facility-administered medications on file prior to visit.       Review of Systems     Constitutional: Negative for fevers and chills  Skin: No rash or lesions  Respiratory: Negative for cough, wheezing, or hemoptysis  Cardiovascular: as per HPI  Gastrointestinal: Negative for abdominal pain, nausea, vomiting and diarrhea  Musculoskeletal:  No arthritic symptoms  Genitourinary: Negative for dysuria  All other systems were reviewed and are negative except what is stated in the HPI    Physical Exam     Vitals:    06/29/20 1318   BP: 157/76   Pulse: 84       Body mass index is 28.57 kg/m.    General: Patient appears their stated age, well-nourished. Alert  and in no apparent distress.  Lungs: Respiratory effort unlabored, chest expansion symmetric.  Cardiac: RRR, no carotid bruits, no JVD.   Extremities warm,  Abd: Soft, nondistended, nontender. No guarding or rebound, No mid line pulsatile mass   EXB:MWUX ROM in all 4 extremities, symmetric Well-healed right neck incision  Skin: Color appropriate for race, Skin warm, dry, no gangrene, no non healing ulcers, no varicose veins , no hyperpigmentation, no lipo-dermatosclerosis  Neuro: Good insight and judgment, oriented to person, place, and time CN II-XII intact, gross motor and sensory intact      Labs     CBC:   WBC   Date/Time Value Ref Range Status   06/12/2016 04:28 AM 9.75 3.50 - 10.80 x10 3/uL Final     RBC   Date/Time Value Ref Range Status   06/12/2016 04:28 AM 3.49 (L) 4.20 - 5.40 x10 6/uL Final     Hgb   Date/Time Value Ref Range Status   06/12/2016 04:28 AM 10.9 (L) 12.0 - 16.0 g/dL Final     Hematocrit   Date/Time Value Ref Range Status   06/12/2016 04:28 AM 32.4 (L) 37.0 - 47.0 % Final     MCV   Date/Time Value Ref Range Status   06/12/2016  04:28 AM 92.8 80.0 - 100.0 fL Final     MCHC   Date/Time Value Ref Range Status   06/12/2016 04:28 AM 33.6 32.0 - 36.0 g/dL Final     RDW   Date/Time Value Ref Range Status   06/12/2016 04:28 AM 12 12 - 15 % Final     Platelets   Date/Time Value Ref Range Status   06/12/2016 04:28 AM 209 140 - 400 x10 3/uL Final       CMP:   Sodium   Date/Time Value Ref Range Status   06/12/2016 04:28 AM 141 136 - 145 mEq/L Final     Potassium   Date/Time Value Ref Range Status   06/12/2016 04:28 AM 3.9 3.5 - 5.1 mEq/L Final     Chloride   Date/Time Value Ref Range Status   06/12/2016 04:28 AM 108 100 - 111 mEq/L Final     CO2   Date/Time Value Ref Range Status   06/12/2016 04:28 AM 25 22 - 29 mEq/L Final     Glucose   Date/Time Value Ref Range Status   06/12/2016 04:28 AM 111 (H) 70 - 100 mg/dL Final     Comment:     ADA guidelines for diabetes mellitus:  Fasting:  Equal to or greater than 126 mg/dL  Random:   Equal to or greater than 200 mg/dL       BUN   Date/Time Value Ref Range Status   06/12/2016 04:28 AM 12 7 - 19 mg/dL Final     Anion Gap   Date/Time Value Ref Range Status   06/12/2016 04:28 AM 8.0 5.0 - 15.0 Final       Lipid Panel No results found for: CHOL, TRIG, HDL    Coags: No results found for: PT, INR, PTT    Assessment and Plan       1. Carotid stenosis, asymptomatic, right  US Carotid Duplex Dopp Comp Bilateral       Audrey Gilmore is a 65 y.o. female who presents for routine follow-up of right carotid artery endarterectomy which was performed many years ago.  At the present time she does not have any significant complaints.  Patient has undergone follow-up duplex ultrasound.  The duplex ultrasound reveals widely patent right carotid artery endarterectomy with minimal left carotid artery stenosis therefore should continue with statin and antiplatelet therapy and I will be seeing her back for routine follow-up in 1 year.  This was explained to the patient and has agreed.    Audrey Starcher, MD, FACS,  RPVI  [facility]  Chief, Section of Vascular Surgery  [place]  [facility]

## 2021-05-22 ENCOUNTER — Encounter (INDEPENDENT_AMBULATORY_CARE_PROVIDER_SITE_OTHER): Payer: Self-pay

## 2021-05-22 NOTE — Progress Notes (Signed)
THIS FORM IS FOR INTERNAL USE FOR VASCULAR SURGERY AND IS NOT A PROGRESS NOTE      Room:         Appt Date/Time: 05/23/2021 @ 3:10pm     66 y.o. female   PCP: Willey Blade, MD  Referring Physician:   N/A       Appointment Type: Follow-Up  Imaging: [place] Today-Carotid Duplex     Chief Complaint/HPI: Annual F/U carotid stenosis.   H/O R CEA 06/11/2016  Date of Last Visit:  06/29/2020     Allergies:   Allergies   Allergen Reactions    Bactrim [Sulfamethoxazole-Trimethoprim] Hives and Itching    Cephalosporins Hives and Itching    Ciprofloxacin Other (See Comments)     Severe joint pain    Dye [Iodinated Contrast Media] Itching and Other (See Comments)     Sneezing, itching    Levaquin [Levofloxacin] Other (See Comments)     Severe joint pain    Lipitor [Atorvastatin] Other (See Comments)     Muscle ache/spasms/cramps  Headache    Lidocaine Palpitations         Selected Medications:  Aspirin    Focused Prior Medical History:  SVT              Smoker       X    Former Smoker          Never Smoker      Vital Signs this Visit Pulses  HT  BP R BP L Temp   Rad Ulnar Brach Fem Pop DP PT        R          WT  Pulse R Pulse L SpO2       L          Imaging results:        Plan of Care:

## 2021-05-23 ENCOUNTER — Ambulatory Visit (INDEPENDENT_AMBULATORY_CARE_PROVIDER_SITE_OTHER): Payer: Medicare Other

## 2021-05-23 ENCOUNTER — Encounter (INDEPENDENT_AMBULATORY_CARE_PROVIDER_SITE_OTHER): Payer: Self-pay | Admitting: Specialist

## 2021-05-23 ENCOUNTER — Ambulatory Visit (INDEPENDENT_AMBULATORY_CARE_PROVIDER_SITE_OTHER): Payer: Medicare Other | Admitting: Specialist

## 2021-05-23 VITALS — BP 179/108 | HR 101 | Temp 98.1°F | Ht 68.0 in | Wt 158.0 lb

## 2021-05-23 DIAGNOSIS — I6521 Occlusion and stenosis of right carotid artery: Secondary | ICD-10-CM

## 2021-05-23 DIAGNOSIS — I6522 Occlusion and stenosis of left carotid artery: Secondary | ICD-10-CM

## 2021-05-23 NOTE — Progress Notes (Signed)
[facility]    Chief Complaint   Patient presents with    Annual Exam     Annual F/U carotid stenosis      History of Present Illness     Audrey Gilmore is a 66 y.o. female who presents for routine follow-up of right carotid artery endarterectomy which was performed 05/2016.  Patient denies any recent symptoms of lateralizing neurologic ischemia including stroke, TIA, monocular visual changes, or amaurosis fugax. Patient has undergone follow-up duplex ultrasound.      She is compliant with Asprin 81mg  taken every other day due to bruising. She is intolerant to statins.  Past Medical History     Past Medical History:   Diagnosis Date    Arthritis     Bursitis     Carotid stenosis, asymptomatic, right 05/16/2016    Chronic pain     Follows w/Pain Management for many years    Dysphagia     Gastroesophageal reflux disease     Hiatal hernia     Low back pain     Osteopenia     Palpitations     Due to dehydration/increased caffeine intake    Paroxysmal SVT (supraventricular tachycardia)     Last episode ~1992    Post-operative nausea and vomiting        Allergies     Allergies   Allergen Reactions    Bactrim [Sulfamethoxazole-Trimethoprim] Hives and Itching    Cephalexin Itching    Cephalosporins Hives and Itching    Ciprofloxacin Other (See Comments)     Severe joint pain    Dye [Iodinated Contrast Media] Itching and Other (See Comments)     Sneezing, itching    Levaquin [Levofloxacin] Other (See Comments)     Severe joint pain    Lipitor [Atorvastatin] Other (See Comments)     Muscle ache/spasms/cramps  Headache    Lisinopril Swelling    Lidocaine Palpitations     Medications     Current Outpatient Medications on File Prior to Visit   Medication Sig Dispense Refill    aspirin EC 81 MG EC tablet Take 81 mg by mouth every other day      Cholecalciferol 50 MCG (2000 UT) Tab Take 2,000 Units by mouth      LORazepam (ATIVAN) 0.5 MG tablet Take 0.5 mg by mouth daily      Multiple Vitamins-Minerals (multivitamin  with minerals) tablet Take 1 tablet by mouth daily      traMADol (ULTRAM) 50 MG tablet TAKE 2 TABLETS BY MOUTH EVERY 8 HOURS   Takes BID  1    cloNIDine (CATAPRES) 0.1 MG tablet TAKE 0.5 TABLETS BY MOUTH EVERY 8 HOURS IF NEEDED FOR HIGH BLOOD PRESSURE (SBP>170 MMHG). (Patient not taking: Reported on 05/23/2021)      Lactobacillus Rhamnosus, GG, (CULTURELLE PO) Take 1 capsule by mouth daily. (Patient not taking: Reported on 05/23/2021)      [DISCONTINUED] Cholecalciferol (VITAMIN D3 PO) daily  2    [DISCONTINUED] co-enzyme Q-10 30 MG capsule Take 30 mg by mouth 3 (three) times daily (Patient not taking: Reported on 05/23/2021)      [DISCONTINUED] Probiotic Product (Acidophilus/Goat Milk) Cap Take 1 capsule by mouth (Patient not taking: Reported on 05/23/2021)       No current facility-administered medications on file prior to visit.       Review of Systems     Constitutional: Negative for fevers and chills  Skin: No rash or lesions  Respiratory: Negative for  cough, wheezing, or hemoptysis  Cardiovascular: as per HPI  Gastrointestinal: Negative for abdominal pain, nausea, vomiting and diarrhea  Musculoskeletal:  No arthritic symptoms  Genitourinary: Negative for dysuria  All other systems were reviewed and are negative except what is stated in the HPI    Physical Exam     Visit Vitals  BP (!) 179/108 (BP Site: Right arm, Patient Position: Sitting, Cuff Size: Medium)   Pulse (!) 101   Temp 98.1 F (36.7 C) (Temporal)   Ht 1.727 m (5\' 8" )   Wt 71.7 kg (158 lb)   SpO2 97%   BMI 24.02 kg/m      Body mass index is 24.02 kg/m.    General:  Patient appears their stated age, well-nourished.  Alert and in no apparent distress.  Lungs: Respiratory effort unlabored, chest expansion symmetric.  Cardiac: RRR, no carotid bruits, no JVD.   Extremities warm,  Abd: Soft, nondistended, nontender.  No guarding or rebound, No mid line pulsatile mass   ZOX:WRUE ROM in all 4 extremities, symmetric Well-healed right neck incision  Skin:  Color appropriate for race, Skin warm, dry, no gangrene, no non healing ulcers, no varicose veins , no hyperpigmentation, no lipo-dermatosclerosis  Neuro: Good insight and judgment, oriented to person, place, and time, gross motor and sensory intact     Assessment and Plan     1. Carotid stenosis, asymptomatic, right  US Carotid Duplex Dopp Comp Bilateral      2. Asymptomatic stenosis of left carotid artery  US Carotid Duplex Dopp Comp Bilateral        Audrey Gilmore is a 66 y.o. female who presents for routine follow-up of right carotid artery endarterectomy which was performed 05/2016.  At the present time she remains asymptomatic. Carotid duplex ultrasound performed in our vascular lab reveals widely patent right carotid artery endarterectomy with minimal left carotid artery stenosis; therefore, she should continue with antiplatelet therapy and I will be seeing her back for routine follow-up in 1 year.  This was explained to the patient and has agreed.    We appreciate the opportunity to be involved in your patient's care. Please do not hesitate to contact the office with any questions or concerns.     Sincerely,  [name], MD, FACS, RPVI  [facility]  Chief, Section of Vascular Surgery  [place]  [facility]    Sahej Schrieber McCaffrey-Lazo, [facility]  Vascular Nurse Practitioner  [facility]  [address]  [place], [place] [address]  T  [phone] F [phone]     Incident to service performed with physician present in the office in accordance with this patient's established plan of care.

## 2022-02-26 ENCOUNTER — Other Ambulatory Visit: Payer: Self-pay | Admitting: Internal Medicine

## 2022-02-26 DIAGNOSIS — I6521 Occlusion and stenosis of right carotid artery: Secondary | ICD-10-CM

## 2022-03-16 ENCOUNTER — Other Ambulatory Visit (INDEPENDENT_AMBULATORY_CARE_PROVIDER_SITE_OTHER): Payer: Self-pay | Admitting: Internal Medicine

## 2022-03-16 DIAGNOSIS — I509 Heart failure, unspecified: Secondary | ICD-10-CM

## 2022-04-09 ENCOUNTER — Ambulatory Visit: Payer: Medicare Other

## 2022-04-10 ENCOUNTER — Ambulatory Visit: Admission: RE | Admit: 2022-04-10 | Payer: Medicare Other | Source: Ambulatory Visit

## 2022-04-10 ENCOUNTER — Inpatient Hospital Stay: Admission: RE | Admit: 2022-04-10 | Payer: Medicare Other | Source: Ambulatory Visit

## 2022-08-23 ENCOUNTER — Encounter (INDEPENDENT_AMBULATORY_CARE_PROVIDER_SITE_OTHER): Payer: Self-pay

## 2022-08-23 NOTE — Progress Notes (Signed)
THIS FORM IS FOR INTERNAL USE FOR VASCULAR SURGERY AND IS NOT A PROGRESS NOTE      Room:         Appt Date/Time: 08/24/22 @10 :30 am    67 y.o. female   PCP: Willey Blade, MD  Referring Physician:   n/a      Appointment Type: Follow-Up  Imaging: [place] Today- US Carotid    Chief Complaint/HPI: Annual routine f/u- Right carotid artery endarterectomy 05/2016 by Dr. Lucianne Muss surveillance    Date of Last Visit:  05/23/21    Allergies:   Allergies   Allergen Reactions    Bactrim [Sulfamethoxazole-Trimethoprim] Hives and Itching    Cephalexin Itching    Cephalosporins Hives and Itching    Ciprofloxacin Other (See Comments)     Severe joint pain    Dye [Iodinated Contrast Media] Itching and Other (See Comments)     Sneezing, itching    Levaquin [Levofloxacin] Other (See Comments)     Severe joint pain    Lipitor [Atorvastatin] Other (See Comments)     Muscle ache/spasms/cramps  Headache    Lisinopril Swelling    Lidocaine Palpitations         Selected Medications:  Aspirin    Focused Prior Medical History:  Palpitations             Smoker           Former Smoker          Never Smoker      Vital Signs this Visit Pulses  HT  BP R BP L Temp   Rad Ulnar Brach Fem Pop DP PT        R          WT  Pulse R Pulse L SpO2       L          Imaging results:        Plan of Care:

## 2022-08-24 ENCOUNTER — Ambulatory Visit (INDEPENDENT_AMBULATORY_CARE_PROVIDER_SITE_OTHER): Payer: Medicare Other

## 2022-08-24 ENCOUNTER — Encounter (INDEPENDENT_AMBULATORY_CARE_PROVIDER_SITE_OTHER): Payer: Self-pay

## 2022-08-24 VITALS — BP 170/96 | HR 87 | Ht 67.5 in | Wt 157.0 lb

## 2022-08-24 DIAGNOSIS — I6521 Occlusion and stenosis of right carotid artery: Secondary | ICD-10-CM

## 2022-08-24 DIAGNOSIS — I6522 Occlusion and stenosis of left carotid artery: Secondary | ICD-10-CM

## 2022-08-24 NOTE — Progress Notes (Signed)
[facility]    Chief Complaint   Patient presents with    Follow-up     Annual routine f/u- Right carotid artery endarterectomy 05/2016 by Dr. Lucianne Muss surveillance     History of Present Illness     Audrey Gilmore is a 67 y.o. female who presents for routine follow-up of right carotid artery endarterectomy which was performed 05/2016.  Patient has undergone follow-up duplex ultrasound.      Patient denies any recent symptoms of lateralizing neurologic ischemia including stroke, TIA, monocular visual changes, or amaurosis fugax.     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 She is being worked up for this at [facility] and is pending a spinal tap in the near future.    She has also noticed worsening palpitations for which she will be seeing cardiology next week    She is compliant with Asprin 81mg  taken every other day due to bruising. She is intolerant to statins.  Past Medical History     Past Medical History:   Diagnosis Date    Arthritis     Bursitis     Carotid stenosis, asymptomatic, right 05/16/2016    Chronic pain     Follows w/Pain Management for many years    Dysphagia     Gastroesophageal reflux disease     Hiatal hernia     Low back pain     Osteopenia     Palpitations     Due to dehydration/increased caffeine intake    Paroxysmal SVT (supraventricular tachycardia)     Last episode ~1992    Post-operative nausea and vomiting      Allergies     Allergies   Allergen Reactions    Bactrim [Sulfamethoxazole-Trimethoprim] Hives and Itching    Cephalexin Itching    Cephalosporins Hives and Itching    Ciprofloxacin Other (See Comments)     Severe joint pain    Dye [Iodinated Contrast Media] Itching and Other (See Comments)     Sneezing, itching    Levaquin [Levofloxacin] Other (See Comments)     Severe joint pain    Lipitor [Atorvastatin] Other  (See Comments)     Muscle ache/spasms/cramps  Headache    Lisinopril Swelling    Lidocaine Palpitations     Medications     Current Outpatient Medications on File Prior to Visit   Medication Sig Dispense Refill    aspirin EC 81 MG EC tablet Take 81 mg by mouth every other day      Cholecalciferol 50 MCG (2000 UT) Tab Take 2,000 Units by mouth      cloNIDine (CATAPRES) 0.1 MG tablet TAKE 0.5 TABLETS BY MOUTH EVERY 8 HOURS IF NEEDED FOR HIGH BLOOD PRESSURE (SBP>170 MMHG). (Patient not taking: Reported on 05/23/2021)      Lactobacillus Rhamnosus, GG, (CULTURELLE PO) Take 1 capsule by mouth daily. (Patient not taking: Reported on 05/23/2021)      LORazepam (ATIVAN) 0.5 MG tablet Take 0.5 mg by mouth daily      Multiple Vitamins-Minerals (multivitamin with minerals) tablet Take 1 tablet by mouth daily      traMADol (ULTRAM) 50 MG tablet TAKE 2 TABLETS BY MOUTH EVERY 8 HOURS   Takes BID  1     No current facility-administered medications on file prior to visit.       Review of Systems   Per HPI  Physical Exam     Visit Vitals  BP (!) 170/96 (BP Site: Left arm, Patient Position: Sitting, Cuff Size: Medium)   Pulse 87   Ht 1.715 m (5' 7.5")   Wt 71.2 kg (157 lb)   SpO2 98%   BMI 24.23 kg/m     Body mass index is 24.23 kg/m.    General:  Patient appears their stated age, well-nourished.  Alert and in no apparent distress.  Lungs: Respiratory effort unlabored, chest expansion symmetric.  Cardiac: RRR, no carotid bruits, no JVD.   Extremities: warm, and dry.  Abd: Soft, nondistended, nontender.  No guarding or rebound, No mid line pulsatile mass   Ext: Full ROM in all 4 extremities, symmetric Well-healed right neck incision  Skin: Color appropriate for race, Skin warm, dry, no gangrene, no non healing ulcers, no varicose veins , no hyperpigmentation, no lipo-dermatosclerosis  Neuro: Good insight and judgment, oriented to person, place, and time, gross motor and sensory intact.    Pulse exam:   Radial Femoral Popliteal DP PT    Right Palpable Palpable Palpable Palpable Palpable   Left Palpable Palpable Palpable Palpable Palpable     Imaging:   08/24/2022 Carotid Duplex performed in our vascular lab:  1. The right internal carotid artery appears patent and within normal limits status post carotid endarterectomy.   2. 1-49% stenosis in the left internal carotid artery.   3. Hemodynamically insignificant stenosis in the bilateral external carotid arteries.   4. Bilateral common carotid and subclavian arteries appear patent with no evidence of stenosis.   5. Bilateral vertebral arteries appear patent with antegrade flow.     05/23/2021 Carotid Duplex performed in our vascular lab:  1. The extracranial carotid arteries, proximal vertebral and proximal subclavian arteries were evaluated with high resolution imaging, color Doppler, and spectral Doppler techniques. Findings are as follows:   2. No evidence of hemodynamically significant stenosis post right carotid endarterectomy.   3. There is some intimal thickening throughout the left carotid system and a small amount of heterogeneous plaque, which extends to the origin of the internal carotid artery. This appears to be creating a 1-49% stenosis.   4. The bilateral external carotid arteries appear patent.   5. Bilateral vertebral arteries appear patent with antegrade flow.   6. Bilateral subclavian arteries appear patent.     07/16/2022 Carotid Duplex performed in our vascular lab:  1. The extracranial carotid arteries, proximal vertebral and proximal subclavian arteries were evaluated with high resolution imaging, color Doppler, and spectral Doppler techniques. Findings are as follows:   2. There is some intimal thickening throughout the right carotid system. No evidence of hemodynamically significant disease post right carotid endarterectomy.   3. There is some intimal thickening throughout the left carotid system. The peak systolic velocities and the small amount of homogeneous plaque  formation at the left carotid bulb, which extends to the origin of the internal carotid artery. This appears to be creating a 1-49% stenosis.   4. The external carotid arteries are widely patent.   5. Antegrade flow was in the vertebral arteries bilaterally.   6. The subclavian arteries are widely patent.     Results reviewed with patient in the office today.  Assessment and Plan     1. Carotid stenosis, asymptomatic, right  US Carotid Duplex Dopp Comp Bilateral        Luis Sami Franqui is a 67 y.o. female who presents for routine follow-up of right carotid artery endarterectomy which was  performed 05/2016.  She has remained asymptomatic and carotid duplex imaging performed today demonstrates widely patent R ICA and stable L ICA stenosis of 1-49%.    - Call 911 and proceed directly to the closest emergency department for any s/s of lateralizing neurologic ischemia, stroke, TIA, or amaurosis fugax.    - Continue Aspirin 81mg  every other day for management of arterial disease.    - Follow-up in 1 year with repeat carotid duplex.       We appreciate the opportunity to be involved in your patient's care. Please do not hesitate to contact the office with any questions or concerns.     Sincerely,  Matheson Vandehei McCaffrey-Lazo, FNP-C  Vascular Nurse Practitioner  [facility]  [address]  [place], [place] [address]  T  [phone] F [phone]
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
## 2023-06-27 NOTE — Progress Notes (Addendum)
 [facility] DEPARTMENT OF NEUROLOGY  NEUROMUSCULAR CLINIC    CHIEF COMPLAINT: neuropathy    PRIMARY CARE PHYSICIAN: Laquetta LABOR Gebreyesus    HISTORY OF PRESENT ILLNESS: Audrey Gilmore is a 68 y.o. female with PMHx significant for T6-T7 disc herniation idiopathic, peripheral neuropathy, De Quervain's on L, frozen shoulder on L, R CEA (2018), hearing loss, & prediabetes who presents for evaluation of neuropathy    Past neurologic history (summarized from general neuro clinic note 01/04/2022)  Patient presented for 10+ years of neuropathy that started intermittently and toes and has become more progressive encrusted legs.  However, seems like there was a marked worsening after she got flu and COVID vaccines.    She reports that she still gets pins and needles and burning sensations in legs > hands regularly.     Her gait has progressively been impaired, as she reports being wobbly when she walks more recently. It's hard for her to say if the gait is worse in the dark as she always keeps some lights on.     She feels weaker all over and feels like she has lost muscle bulk (everything is flabby). Feels like dropping things and losing grip more.     HPI from Initial Visit (05/2022)  Patient confirms that she had prolonged 10+ year history of neuropathy that manifested as tingling in her fingertips and toes.  However, had a rapid acceleration of her decline after she received the flu shot and the COVID shot at the same time, in late 2022 (November or December).    Reports multiple things have been worse since then.  Predominant concern seems to be associated with her tremor, as it makes her both self-conscious and affects day-to-day living.  Gives example that she can not put on her eyeliner her because of her tremor.  Does not think she had the tremor prior to the the vaccination of concern.    Also endorses bandlike sensation around her ankle, the bottom of her feet, as well as patchy areas of numbness along the dorsum of the right hand, and the left lateral knee.    Endorses that she feels wobbly when she walk.  From a functional strength standpoint, has difficulty with opening bottles.  Also has had some progressive decline in terms of having trouble with writing, that is it is now progressed to trouble holding the mouse.    She also had explosion of severe tinnitus at onset.     Many of these sx are somewhat progressed since last evaluation.     At last visit (01/07/23)  Tremor improved with metoprolol  from cards.   Increased difficulty walking, burning getting more severe and waking her up at night. Cold tiles helped it. Swallowing getting worse.   Referred to [facility] for second opinion + EDX + US  for CIDP.     Interval events  [facility] evaluation:  04/09/23 Clinic Eval Dr. Leopoldo: all strength 5/5 including grip and intrinsic hand muscles. The patient's symptoms, including progressive tingling, numbness, weakness, and tremors, suggest a diagnosis of CIDP.    05/22/23 Edx Dr. Santo:     Interval history  Her husband joins her for this visit for the 1st time.    We allowed some time for the patient to express her frustrations at coordination of care difficulties that have come about since her prior visit.  I apologize for these.    Clinically, the patient endorses that she has been getting spasms across her chest that  are often waking her up at night.  These spasms seemed to radiate from roughly the T4 level and then radiate up into her shoulders in the back of her neck.  Going with this she also endorses a cold sensation along the tops of her arm, but a hot sensation in her armpits.  There is a 2nd band as well that is at the bottom of her ribcage that also wraps around.  Because of the bandlike sensation around her chest, some of which have woken her up at night, she has been following more closely with Cardiology.  It is notable that recently she has been admitted to [facility] with atrial fibrillation, that necessitated metoprolol  dosing adjustment.  In the past the patient has noted improvement of her tremors and response to the metoprolol  (despite initially having concerns for taking metoprolol  just for the tremors) however she now was concern that she is developing a ?tolerance ?to this medication.  It is notable that the patient seems to have labile blood pressure, which complicates the increased dose of metoprolol .    The patient endorses sensation of dizziness.  Reports within 30 seconds of swallowing the metoprolol  pill she will have this sensation that is tough to describe, but seems like a head sensation.  This is different from the unsteadiness that she endorses while walking.  That is sensation is more of a ?getting pushed forward sensation. She also has to be cautious when going from sitting to standing.     Fortunately, the patient no longer is having frequent cramping in the calves, ankles, and hamstrings.  However she has had a progression in the abnormal sensation in her feet. The quality of the tingling has changed to be a little more stinging and it is everywhere especially in her face and throat.     The patient also endorses more difficulty swallowing, especially things like dry toast or bread. She also has to be mindful of vitamins/pills.     With respect to weakness reports that her legs feel like dead weight. Functional weakness is most pronounced in her walking where she has to rely on husband for balance, unclear if this is actual weakness related or not. She seems to also have developed a snapping/locking of her middle finger.     INCAT:   Arm disability: 2: Symptoms, in one arm or both arms, affecting but not preventing any of the previously mentioned functions trouble eith earing, buttons, fine motor control -> handwriting has goten worse.   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    cyanocobalamin  (VITAMIN B-12) 1000 MCG/ML injection     Lactobacillus Rhamnosus, GG, (CULTURELLE PO) Take by mouth Daily.    lorazepam  (ATIVAN ) 0.5 MG tablet Take 2 tablets by mouth 2 times daily.    metoprolol  tartrate (LOPRESSOR ) 12.5 mg partial tablet Take 1 half tablet by mouth 2 times daily.    Multiple Vitamins-Minerals (MULTIVITAMIN WITH MINERALS) tablet Take 1 tablet by mouth daily.    traMADol  (ULTRAM ) 50 MG tablet Take 1 tablet by mouth every 4 hours as needed.    vitamin D3 (CHOLECALCIFEROL ) 50 MCG (2000 UT) TABS Take 2 tablets by mouth.     No current facility-administered medications for this visit.       ALLERGIES:   Allergies   Allergen Reactions    Levaquin Swelling     All Quins!!!  Joint pain    Per pt tendon and cartilage damage        Beta Adrenergic Blockers Other (See Comments)     Propanolol gave her nightmares      Cephalexin Itching    Ibuprofen Other (See Comments)     Elevated B/p, headache and tachycardia    Influenza Vaccines Other (See Comments)     Tingling, nightmares    Iv Contrast Itching     sneezing    Bactrim [Sulfamethoxazole W-Trimethoprim] Rash    Lidocaine Palpitations     cardiac arrhythmia with dental work        FAMILY HISTORY:  Family History   Problem Relation Age of Onset    Heart Attack Brother     Other Other         No RA or Lupus       SOCIAL HISTORY:  Social History     Socioeconomic History    Marital status: Married     Spouse name: None    Number of children: None    Years of education: None    Highest education level: None   Tobacco Use    Smoking status: Never    Smokeless tobacco: Never   Vaping Use    Vaping Use: Never used   Substance and Sexual Activity    Alcohol use: Yes     Comment: occasionally    Drug use: No   Social History Narrative    Daughter is an Nutritional therapist. Lives in [place], [place].     Retired, Systems developer.    Married x 37 yrs       REVIEW OF SYSTEMS: A 14 system review of systems was obtained and pertinent positives and negatives were enumerated above in the history of present illness. All other reviewed systems  were negative.    PHYSICAL EXAMINATION:     Neurologic Exam:  Mental status: A&O to conversation     Language: Fluent language production without paraphasic errors.     Cranial nerves: EOMI bilaterally. No nystagmus observed. Symmetric sensation in V1-V3 distributions bilaterally. Facial expression is symmetric with bilateral activation. Poor hearing. Palate elevates symmetrically; uvula midline upon elevation. No dysarthria. Shoulder shrug 5/5 bilaterally. Tongue protrudes to midline without evidence of fasciculations.     Motor: Normal bulk present in all four extremities. Normal tone in BUE/BLE.No resting or postural  tremor. A fine postural and rest tremor is appreciated.           Shoulder abduction* Elbow flexion*  Elbow extension  Wrist  flexion Wrist  Extension*  Finger  abduction Finger Extension     APB   RIGHT  5  5  5  5  5 4  5  Deferred pain    LEFT  5  5  5  5  5  4+ 4+ 4+              Hip flexion*  Hip ext Hip Abd Knee flexion Knee extension*  Dorsiflexion* Plantar flexion EHL   RIGHT  4 (pain ltd)  4- At least 4 4  5  5  At least 4 (pain) 5   LEFT  5-  4- 5 At least 4 (cramp)  5  5 5  5      *MRC sum score trend (does not completely capture her weakness):  06/28/23: 29/30      Sensation:   BUE proprioception intact to fine excursions of DIP.   BLE okay to fine excursions. Vibration absent in toes and impaired (early extinction) up to malleolus   Hyperesthesia in the feet     Reflexes: MSR 2+, more brisk in R than L. Absent ankle though   Coordination: No dysmetria or ataxia on finger-nose-finger.   Gait/station: Normal casual gait. Romberg negative.      PERTINENT DIAGNOSTIC WORKUP  Labs (many at [facility] ):  - 2021: +ANA x2, Lyme neg, ANCA neg, anti-SS neg  - 2022: vitamin B12 336, MMA 197 (wnl), SPEP wnl, folate 23, TSH 3.53, vitamin D 65,  LDL 113, cholesterol 187, triglycerides 99  - 05/2021: vitamin D 71, ESR 4, CRP <0.5, CBC wnl, CMP wnl  - 10/2021: Mg 2, thiamine 130 (wnl), pyridoxine 50 (upper limit of nl), +ANA w/homogenous pattern (1:80 titer), ACE wnl, Cu 130 (wnl), serum Ig ratios wnl, HBsAg/HBcAb neg, HCV Ab neg  - 10/2022: A1c 5.6, SPEP/Ig quant normal, Light-chain with minimal kappa elevation but ratio WNL, ANA negative, ENA negative, CMT [facility] panel negative, [facility] demyelinating panel negative     Imaging (summarized radiology interpretations, all from [facility] [place] ):  - MRI brain w/o contrast (06/20/2011): done for headache and unsteady gait; minimal white matter changes  - MRI brain w/o contrast (09/08/2016): done for possible stroke during CEA; stable  - MRI brain w/o contrast (12/07/2020): moderate pontine white matter lesions & mild supratentorial white matter intensities with chronic small vessel changes     - MRA neck w/contrast (12/2011): mild proximal R ICA stenosis (20-30%)  - MRA neck w/contrast (04/2016): severe proximal R ICA stenosis (90%), no proximal L ICA stenosis  - Carotid US  (05/2021): mild L ICA origin stenosis (1-49%); R ICA open s/p CEA     - MRI C-spine w/o contrast (12/04/2020): no central stenosis; moderate narrowing of L neural foramen at C3-C4; mild narrowing at R neural foramen at C5-C6  - MRI C-spine w/o contrast (10/13/2021): mild to moderate DDD with nl cord & no major changes since prior study, noting severe left foraminal stenosis at C3-C4 and mild bilateral foraminal narrowing at C4-C5 through C6-C7     - MRI T-spine w/o contrast (10/2011): mild DDD; no significant central or foraminal narrowing  - MRI T-spine w/o contrast (11/2020): new small disc herniation at T6-T7 compared to 2013 study; slightly increased degenerative changes (moderate spondylosis most prominent at T6-T7 through T8-T9)  - MRI T-spine w/o contrast (10/2021): stable from prior; marrow edema around anterior aspects of T6-T7 through T8-T9 compatible with DDD     - MRI L-spine w/o contrast (11/2011): moderate central canal stenosis & moderate bilateral neural foraminal narrowing at L4-L5 related  to mild disc bulge & marked ligamentum flavum hypertrophy; otherwise scattered degenerative changes without significant narrowing  - MRI L-spine w/o contrast (06/2014): mild progressive of degenerative changes most prominent at L4-L5     NPT ([facility]):  - Neuropsych testing (2019): reportedly normal per patient as documented in outside provider note    ELECTRODIAGNOSTICS  - EMG/NCS ([facility]) (03/21/2020): performed on arms, dx mild R carpal tunnel syndrome, R radial sensory neuropathy, & 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 Per EFNS motor nerve criteria, the significant conduction velocity slowing (>=30% below LLN) in the median and ulnar CMAPs is supportive of demyelinating neuropathy. Also per EFNS sensory nerve criteria, the abnormal radial and ulnar SNAPs is supportive of demyelinating neuropathy. The presence of partial conduction block suggests acquired demyelination, although there is no clear temporal dispersion so this may be a hereditary demyelinating polyneuropathy if the apparent partial conduction is technical in nature.   - NCS/US  [facility]) (05/22/23):  This study is abnormal.  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  While she does not UPS-a ultrasound criteria for CIDP there is still sonographic evidence of diffuse and patchy mild-to-moderate nerve enlargement which can be seen in acquired demyelinating neuropathies .  Clinical note: Findings were reviewed with the patient and her husband who expressed understanding.  In the patient's clinical contact her above study could be consistent with a sensory predominant CIDP or other variant.    LUMBAR PUNCTURE (09/24/22)  OP could not be obtained due to slow CSF flow. 0 WBC, 0 RBC, Protein 33, Glucose 60.     ASSESSMENT/PLAN: Ms. Gittens is a 68 y.o. female with a h/o PMHx significant for T6-T7 disc herniation, B12 depletion, peripheral neuropathy, De Quervain's on L, frozen shoulder on L, R CEA (2018), hearing loss, & prediabetes who presents for evaluation of neuropathy.     To summarize she had baseline neuropathy for approximately a decade, but then had marked progression after receiving both flu and COIVD vaccines at the same time in November/December 2022. Her NCS had some demyelinating features - particularly slow motor CVs -  as well as some prolonged distal latencies on SNAPs (cold temperatures) and some areas c/f conduction block. However, her exam was relatively benign with essentially normal strength testing, intact reflexes, and only some sensory changes, which brought further uncertainty to the dx.     She had poor reactions to medications/treatments before which made her understandably hesitant to empiric trials of medication IVIG. W/u therefore has included a LP where there was no albuminocytologic dissociation. She does not want gadolinium, rendering an MRI of limited utility. Lab w/u including [facility] sendout panel for demyelinating dz and [facility] CMT panel returned negative.     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 EDX studies similarly showed features c/f acquired demyelination (again more pronounced in motor than sensory NCS) and an US  that had features c/w acquired demyelination, but did not meet full sonographic criteria for such.     Today, the patient's exam does show some increased weakness, more pronounced distally in the upper extremities, but proximally in the lowers - noting that pain is a marked confounder in her examination. She doesn't have the pronounced sensory ataxia that would be expected based on  her report of unsteadiness, and similarly her proprioception was surprisingly intact despite a seemingly profound vibratory disturbance. Her reflexes, except at her ankles, remain intact.     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 Because of the uncertainty of her dx, will repeat some antibody testing - specifically Anti-MAG given distal predominance as well as contactin/neurofascin given the tremor - prior to empiric trial of IVIG (pending clearance from her cardiologist) that may affect these levels. Given the potential dysautonomic component of things (could also be related to prior CEA) will also repeat SPEP/light-chains given slight elevation in kappa LC (normal Kappa/lambda) previously. Lastly, will get B12 rechecked to ensure her repletion has been adequate.    It will be important for other providers/specialists to be vigilant about her given her sundry symptoms, and that they may not all be related to suspected dx of CIDP. In light of this, recommend her trouble swallowing be worked up further.    PLAN:   Labs:  [facility] CIDP panel  [facility] MAG    B12  SPEP + light chains  If okay per PCP, plan to start IVIG  loading dose 2 g/kg IBW divided over 3 days with pretreatment  then 1 g/kg IBW monthly   Referral to SLP for swallowing/choking difficulties. Should also discuss with PCP about GI eval (reports she is past due for EGD)  Referral to PT for unsteady gait, especially go get assistance device   Agree with her plan to see hand clinic for finger snapping in (trigger finger?)    Patient was seen and discussed with Dr. Solorzano    Darayus C. Ronell, M.D.   Neuromuscular Fellow       I have personally interviewed and examined the patient on 06/28/2023. I have reviewed the provider's history, physical exam, assessment and treatment plans. I concur with or have edited all elements of the provider's note.    Guillermo E. Solorzano, MD  Associate Professor of Neurology

## 2023-07-01 NOTE — Progress Notes (Signed)
 I have personally interviewed and examined the patient on 06/28/2023. I have reviewed the provider's history, physical exam, assessment and treatment plans. I concur with or have edited all elements of the provider's note.    Zitlaly Malson, MD

## 2023-07-13 NOTE — Discharge Summary (Signed)
 Patient Name: Audrey Gilmore  Date of Birth: Sep 28, 1955  MRN: [id number]  Hospital: [facility]  Admission Date: 07/10/2023  Discharge Date: 07/13/2023  Inpatient Length of Stay: 0 days  Prior Hospitalization Within 30 Days? No      General Medicine Discharge Summary    Admitting Provider: Geofm Brooks, DO  Discharge Provider: Geofm Brooks, DO    Primary Care Physician at Discharge: Laquetta Richad Roll, MD  PCP Phone Number: [phone]     Discharge Disposition:  Home or Self Care  Discharge Planning       Discharge Planning Flowsheet                      Living Arrangements Spouse/significant other   Do you have animals or pets at home? No    In the last 12 months, was there a time when you were not able to pay the mortgage or rent on time? N   Who is requesting discharge planning? Patient    In the past 12 months, how many times have you moved where you were living? 0   Home or Post Acute Services None    At any time in the past 12 months, were you homeless or living in a shelter (including now)? N   Expected Discharge Disposition Home    Support Systems Spouse/significant other   In the past 12 months, has lack of transportation kept you from medical appointments or from getting medications? no    Assistance Needed no   In the past 12 months, has lack of transportation kept you from meetings, work, or from getting things needed for daily living? No    Type of Residence Private residence   Does the patient need discharge transport arranged? No                  Code Status at Discharge: Full Code    Discharge Diagnoses:  1. PRIMARY DISCHARGE DIAGNOSIS:  Intermittent palpitations  2. 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  Scheduled Referrals & Follow-Ups:  Future Appointments   Date Time Provider Department Center   07/19/2023  3:45 PM Laquetta Richad Roll, MD [facility] None         Hospital Course:  Please refer to full H&P, briefly, patient is a 68 year old female with a history of DDD, Sjogren's, tremors, anxiety, paroxysmal atrial fibrillation/PSVT, hypotension who presents the hospital with intermittent palpitations a spastic like pain pattern that encompasses her abdomen, chest and bilateral neck area.  She was admitted and Cardiology was consulted for recommendations on medication moving forward as patient has demonstrated a lack of tolerance to beta-blockers with multiple side effects.  She recently was switched from metoprolol to atenolol however can not tolerate atenolol due to side effects.  Per cardiology recommendation she was trialed on diltiazem, final recommendations include amiodarone 100 mg b.i.d. and to take Cardizem as needed for rate above 100 with blood pressure holding parameters.  It was also recommended as she continue with anticoagulation, Eliquis .  Discharge planning was discussed with the patient and her husband at the bedside.  She continues to feel the spastic like pattern in her abdomen  chest and neck, she has a follow-up appointment with position at [facility] for workup for CDIP also recommended that she follow-up with electrophysiology for evaluation for ablation due to intolerance of antiarrhythmic/beta-blockers    Last Labs:  Results from last 3 days   Lab Units 07/13/23  0251   WBC AUTO K/uL 6.98   HEMOGLOBIN g/dL 87.1   HEMATOCRIT % 39   MCV fL 95   PLATELETS AUTO K/uL 250     Results from last 3 days   Lab Units 07/13/23  0251   SODIUM mmol/L 137   POTASSIUM mmol/L 4.3   CHLORIDE mmol/L 101   CO2 mmol/L 28   BUN mg/dL 17   CREATININE mg/dL 0.9   EGFR fO/fpw/8.26f*7 >60   GLUCOSE mg/dL 94   CALCIUM mg/dL 9.5     Imaging / Other Studies:    All imaging and other studies were reviewed.    Test  Results Pending at Discharge:         Medication List      START taking these medications    . amiodarone 100 mg tablet; Commonly known as: Pacerone; Take 1 tablet   (100 mg total) by mouth 2 (two) times a day.  . apixaban  5 mg tablet; Commonly known as: Eliquis ; Take 1 tablet (5 mg   total) by mouth 2 (two) times a day.  . dilTIAZem 120 mg 24 hr capsule; Commonly known as: Tiazac; Take 1   capsule (120 mg total) by mouth 1 (one) time each day. Take as needed for   HR >100     CONTINUE taking these medications    . aspirin  81 mg chewable tablet  . cholecalciferol 50 mcg (2,000 unit) capsule; Commonly known as: Vitamin   D-3  . lactobacillus 10 billion cell capsule; Commonly known as: Culturelle  . LORazepam  0.5 mg tablet; Commonly known as: Ativan   . metoprolol tartrate 25 mg tablet; Commonly known as: Lopressor; Take 0.5   tablets (12.5 mg total) by mouth 3 (three) times a day.  . traMADoL  50 mg tablet; Commonly known as: Ultram      STOP taking these medications    . atenoloL 25 mg tablet; Commonly known as: Tenormin     Refer to the AVS for the most up-to-date medication list and instructions.    DETAILS OF HOSPITAL STAY    Presenting Problem/History of Present Illness:  Palpitations [R00.2]  Paroxysmal atrial fibrillation (CMS-HCC) [I48.0]  Acute chest pain [R07.9]    Consults:   None    Physical Exam at Discharge:  Vital Signs:  Pulse: 74  Resp: 16  BP: 105/66  Temp: 97.5 F (36.4 C)  Weight: 162 lb (73.5 kg)  Constitutional: general appearance is grossly normal, NAD  Heart: regular rate and rhythm, S1, S2 normal  Lungs:  no conversational dyspnea  Extremities: no edema  Neurologic: AAOX3, no focal deficits        Discharge Condition: good         I personally spent over 30 minutes in the discharge process.      SIGNATURE:  Geofm Brooks, DO  DATE:  July 13, 2023  TIME:  10:49 AM

## 2023-07-24 NOTE — Consults (Signed)
 Consult to Neurology  Consult performed by: Damien Macadam, DO  Consult ordered by: Zunaira Arbab, MBBS         Department of Neurology  General Neurology Consult H&P    PATIENT INFORMATION   Patient Name: Audrey Gilmore      MRN: [id number]     Age: 68 y.o.  Gender: female  Date: 07/24/2023 Time: 18:32  Primary Care Provider: Laquetta DELENA Roll      Consult Questions: Pain management for spasms? IVIG treatment inpatient?     CHIEF COMPLAINT   Chief Complaint:   Chief Complaint   Patient presents with   . Abdominal Pain     Endorsing epigastric spasms x 3 weeks. States the pain starts in her stomach and wraps around her left side to her left flank. Denies nausea/vomiting/diarrhea. Denies chest pain/shortness of breath.   SABRA Nickel Reflux     States she sometimes feels a reflux sensation in her throat when experiencing these spasms.    . Spasms     Additionally states the spasms travel in from her flank to her upper back and neck. 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 Takes tramadol  for this at home and that it has not been helping. States she was told if she ever began experiencing CIDP sensations in her chest and throat, she should come in to be seen. Is supposed to start IVIG soon.         HISTORY OF PRESENT ILLNESS:    Audrey Gilmore is a 68 y.o. 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     When we arrived, Audrey Gilmore was leaning against her husband in distress. History was obtained by both of them.    Audrey Gilmore attests to spasm-like pain that starts under her diaphragm, moves up towards her shoulders and around her back, and then up her neck into her throat. She states that the episodes are of varying intensities, but today's episode was  the most intense which she believes was provoked by drinking a glass of cold water. The episodes started over 9 months ago. In the last 6 months the episodes, patient's spasms worsened after the initiation of trying beta blockers with the Cardiology in intensity and frequency. She attests that this pain is undescribable and makes her feel like she might choke or not be able to swallow. The spasms usually starts bilateral lateral ribs squeeze her tight, and moves up towards her sternum, goes across anterior chest, and makes it way to bilateral anterior neck muscles to the lower face. She states the spasms have gotten worse from there even after discontinuation. She notes that this pain is intensified when she takes her Tramadol . After speaking with Ms. Bardales, her spasm had abated when given the IV Valium and she noted that she was back to her baseline neuropathic pain. Patient usually waits out the spasms. She is afraid to swallow when having the episodes.     Audrey Gilmore is followed outpatient by Drs. 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 Her symptoms started  around the time of her COVID and flu vaccines in 2023. She was supposed to start outpatient IVIG in the next few weeks, end of March 2025 at [facility]. Audrey Gilmore additionally spoke with the on-call neurology team last night regarding a similar episode of spasms episode. [name] was wondering if she should be in the hospital instead, and was resistant to starting Baclofen due to a fear of new medications. This led her to come into the ED tonight.     Audrey Gilmore was concerned that this might be her CIDP, or was concerned that this might be related to her gallbladder removal. Per chart review, Audrey Gilmore was seen by OSH GI team  2 days ago for a barium swallow which demonstrated mild presbyesophagus and small hiatal hernia. Audrey Gilmore was also concerned that this might be a cardiac problem, like angina and was worried that these episodes (as well as the beta blockers) may have affected her heart.     During interview, patient describing a reaction to fluoroquinolones as all of her muscles and tendons rupturing at once while reviewing allergies. During interview, she further went on to discuss her MRI findings and EMG Ultrasound and feeling distressed after seeing her swollen nerves. Patient reports she was given Valium through the IV and the spasms feel there are improving. She explains she is worried that the spasms will happen later today.  She states she has not eaten much today because the spasms are very intense.      Sources: patient and family  Emergency Contact:  Primary Emergency Contact: Belisle,[name], Home Phone: [phone]     REVIEW OF SYSTEMS:    Comprehensive review of systems was performed and pertinent findings included in the HPI above. All other systems negative.    MEDICAL HISTORY   I have reviewed the patient's medications, allergies, and past medical, surgical, social, and family histories and have updated them in the patient's chart as appropriate. I acknowledge that the information available under the History tab is up to date. Information felt pertinent to the patient's current presentation is listed below.      Past Medical History:   . Allergic   . Anxiety   . Chondromalacia of knee    bilateral   . Esophagitis   . Heart palpitations   . Heartburn   . Hiatal hernia   . Oral thrush    treated with nystatin   . Peripheral neuropathy   . Thoracic disc herniation   . 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 ABDOMEN SURGERY     . CHOLECYSTECTOMY     . HYSTERECTOMY     . TONSILLECTOMY     . TUBAL LIGATION            Allergies   Allergen  Reactions   . Levaquin Swelling     All Quins!!!  Joint pain    Per pt tendon and cartilage damage       . Ace Inhibitors Angioedema     Patient said BP med caused her angioedema - think an ace     . Beta Adrenergic Blockers Other (See Comments)     Propanolol gave her nightmares     . Cephalexin Itching   . Ibuprofen Other (See Comments)     Elevated B/p, headache and tachycardia   . Influenza Vaccines Other (See  Comments)     Tingling, nightmares   . Iv Contrast Itching     sneezing   . Bactrim [Sulfamethoxazole W-Trimethoprim] Rash   . Lidocaine  Palpitations     cardiac arrhythmia with dental work            {  Social History     Socioeconomic History   . Marital status: Married   Tobacco Use   . Smoking status: Never   . Smokeless tobacco: Never   Vaping Use   . Vaping status: Never Used   Substance and Sexual Activity   . Alcohol use: Yes     Comment: occasionally   . Drug use: No     Social Drivers of Psychologist, Prison And Probation Services Strain: Low Risk  (08/24/2022)    Received from [facility] and [facility], [facility] and [facility]    Overall Financial Resource Strain (CARDIA)    . Difficulty of Paying Living Expenses: Not hard at all   Food Insecurity: No Food Insecurity (07/10/2023)    Received from [facility]    Hunger Vital Sign    . Worried About Programme Researcher, Broadcasting/film/video in the Last Year: Never true    . Ran Out of Food in the Last Year: Never true   Transportation Needs: No Transportation Needs (07/10/2023)    Received from [facility]    [facility] - Transportation    . Lack of Transportation (Medical): No    . Lack of Transportation (Non-Medical): No   Physical Activity: Sufficiently Active (08/24/2022)    Received from [facility] and [facility], [facility] and [facility]    Exercise Vital Sign    . Days of Exercise per Week: 6 days    . Minutes of Exercise per Session: 40 min   Stress: Patient Declined (08/24/2022)    Received from  [facility] and [facility], [facility] and [facility]    [employer] of Occupational Health - Occupational Stress Questionnaire    . Feeling of Stress : Patient declined   Social Connections: Unknown (08/24/2022)    Received from [facility] and [facility], [facility] and [facility]    Social Connection and Isolation Panel [NHANES]    . Frequency of Communication with Friends and Family: Patient declined    . Frequency of Social Gatherings with Friends and Family: Patient declined    . Attends Religious Services: Patient declined    . Active Member of Clubs or Organizations: Patient declined    . Attends Banker Meetings: Patient declined    . Marital Status: Married   Catering Manager Violence: Not At Risk (07/10/2023)    Received from [facility]    Humiliation, Afraid, Rape, and Kick questionnaire    . Fear of Current or Ex-Partner: No    . Emotionally Abused: No    . Physically Abused: No    . Sexually Abused: No   Housing Stability: Low Risk  (07/10/2023)    Received from [facility]    Housing Stability Vital Sign    . Unable to Pay for Housing in the Last Year: No    . Number of Times Moved in the Last Year: 0    . Homeless in the Last Year: No     Outpatient Medications:  Refresh the SmartLink below once you have completed the med rec in the Admissions  Navigator. If you are unable to complete a med rec at the time of admission, please explain why in this space and pull in the most recent med list.    Current Outpatient Medications   Medication Instructions   . aspirin  81 mg, DAILY   . cyanocobalamin (VITAMIN B-12) 1000 MCG/ML injection No dose, route, or frequency recorded.   . Lactobacillus Rhamnosus, GG, (CULTURELLE PO) Daily   . LORazepam  (ATIVAN ) 1 mg, 2 TIMES DAILY   . metoprolol tartrate (LOPRESSOR) 12.5 mg, 3 TIMES DAILY   . Multiple Vitamins-Minerals (MULTIVITAMIN WITH MINERALS) tablet 1 tablet, DAILY   .  traMADol  (ULTRAM ) 50 mg, EVERY 4 HOURS PRN   . vitamin D3 (CHOLECALCIFEROL) 4,000 Units       PHYSICAL EXAMINATION:    Vital Signs:  Current  Temp: 36.5 C (97.7 F)  Heart Rate: 94  BP: (!) 169/78  Resp: 24  SpO2: 100 % (03/12 1648) Past 24 Hours  Temp  Min: 36.4 C (97.5 F)  Max: 36.8 C (98.2 F)  Pulse  Min: 88  Max: 101  BP  Min: 125/83  Max: 169/78  Resp  Min: 18  Max: 24  SpO2  Min: 98 %  Max: 100 %     General Examination:  Constitutional: Sittining in bed, not in acute distress.  Eyes: Anicteric sclerae, no conjunctival injection.  Ears, Nose, Mouth, Throat: Moist mucous membranes, no sores/lesions  Cardiovascular: Extremities warm, well-perfused.  Respiratory:  Breathing comfortably on room air.  Gastrointestinal: Not grossly distended.  Musculoskeletal: No joint deformities observed.   Skin: Discoloration in BL toes  Psychiatric: Intermittently tearful during examination    Neurologic Examination:  Mental status: Alert & oriented to person, place, and time.     Language: No aphasia.     Speech: No dysarthria.    Cranial nerves: PERRL bilaterally. Visual fields full to number counting. EOMI bilaterally; no nystagmus or saccadic intrusions present. Symmetric full sensation to light touch in CNV1-V3 distributions. Facial expression is symmetric at rest and with activation. Hearing is intact to conversation. Palate elevates symmetrically; uvula midline. Shoulder shrug 5/5 bilaterally. Tongue protrudes to midline.      Motor: Normal bulk and tone present in all four extremities. No abnormal movements, tremors, or fasciculations. Pronator drift absent in BUE.  Strength exam to confrontation as below.         Shoulder abduction Elbow flexion  Elbow extension  Wrist  flexion Wrist  Extension  Finger abduction     APB   RIGHT  5  5  5  5  5 4   4+    LEFT  5  5  5  5  5  4+ 4+              Hip flexion*  Hip Abd Knee flexion Knee extension Dorsiflexion Plantar flexion   RIGHT  4 +  4 4 + 5  5 4  (pain)   LEFT  5 4 4+  5  5 5       Sensation:   BLE Vibration present in toes and fingers.    Hyperesthesia in the feet bilaterally  Decreased sensation to light touch in patchy areas in arms and legs     Reflexes: MSR 2+, more brisk in R than L. 0 in Achilles (bilaterally)- exam unchanged from 06/2023. Tenderness in right Achilles  Coordination: No dysmetria or ataxia on finger-nose-finger.   Gait/station: Able to stand up by herself without difficulty. Romberg negative.  DIAGNOSTIC STUDIES   Lab Results:  - CBC: WBC/Hgb/Hct/Plts:  6.66/13.9/40.8/278 (03/12 1328)  - BMP: Na/K/Cl/CO2/BUN/Cr/glu:  137/4.3/101/27/18/1.0/98 (03/12 1328)  - Electrolytes: Ca/Mag/Phos:  9.8/2.2/3.6 (03/12 1328)  - LFTs: TBili/Alk Phos/ALT/AST:  0.7/88/15/26 (03/12 1328)  - Coags:      Prior Imaging/Procedures  Labs (many at [facility] [place] [facility] ):  - 2021: +ANA x2, Lyme neg, ANCA neg, anti-SS neg  - 2022: vitamin B12 336, MMA 197 (wnl), SPEP wnl, folate 23, TSH 3.53, vitamin D 65,  LDL 113, cholesterol 187, triglycerides 99  - 05/2021: vitamin D 71, ESR 4, CRP <0.5, CBC wnl, CMP wnl  - 10/2021: Mg 2, thiamine 130 (wnl), pyridoxine 50 (upper limit of nl), +ANA w/homogenous pattern (1:80 titer), ACE wnl, Cu 130 (wnl), serum Ig ratios wnl, HBsAg/HBcAb neg, HCV Ab neg  - 10/2022: A1c 5.6, SPEP/Ig quant normal, Light-chain with minimal kappa elevation but ratio WNL, ANA negative, ENA negative, CMT Invitae panel negative, Wash[facility] demyelinating panel negative     Imaging (summarized radiology interpretations, all from [facility] ):  - MRI brain w/o contrast (06/20/2011): done for headache and unsteady gait; minimal white matter changes  - MRI brain w/o contrast (09/08/2016): done for possible stroke during CEA; stable  - MRI brain w/o contrast (12/07/2020): moderate pontine white matter lesions & mild supratentorial white matter intensities with chronic small vessel changes     - MRA neck w/contrast (12/2011): mild proximal R ICA stenosis (20-30%)  - MRA neck w/contrast  (04/2016): severe proximal R ICA stenosis (90%), no proximal L ICA stenosis  - Carotid US  (05/2021): mild L ICA origin stenosis (1-49%); R ICA open s/p CEA     - MRI C-spine w/o contrast (12/04/2020): no central stenosis; moderate narrowing of L neural foramen at C3-C4; mild narrowing at R neural foramen at C5-C6  - MRI C-spine w/o contrast (10/13/2021): mild to moderate DDD with nl cord & no major changes since prior study, noting severe left foraminal stenosis at C3-C4 and mild bilateral foraminal narrowing at C4-C5 through C6-C7     - MRI T-spine w/o contrast (10/2011): mild DDD; no significant central or foraminal narrowing  - MRI T-spine w/o contrast (11/2020): new small disc herniation at T6-T7 compared to 2013 study; slightly increased degenerative changes (moderate spondylosis most prominent at T6-T7 through T8-T9)  - MRI T-spine w/o contrast (10/2021): stable from prior; marrow edema around anterior aspects of T6-T7 through T8-T9 compatible with DDD     - MRI L-spine w/o contrast (11/2011): moderate central canal stenosis & moderate bilateral neural foraminal narrowing at L4-L5 related to mild disc bulge & marked ligamentum flavum hypertrophy; otherwise scattered degenerative changes without significant narrowing  - MRI L-spine w/o contrast (06/2014): mild progressive of degenerative changes most prominent at L4-L5       ELECTRODIAGNOSTICS  - EMG/NCS (OSH) (03/21/2020): performed on arms, dx mild R carpal tunnel syndrome, R radial sensory neuropathy, & 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 Per EFNS motor nerve criteria, the significant conduction velocity slowing (>=30% below LLN) in the median and ulnar CMAPs is supportive of demyelinating neuropathy. Also per EFNS sensory nerve criteria, the abnormal radial and ulnar SNAPs is supportive of  demyelinating neuropathy. The presence of partial conduction block suggests acquired demyelination, although there is no clear temporal dispersion so this may be a hereditary demyelinating polyneuropathy if the apparent partial conduction is technical in nature.   -  NCS/US  [facility]) (05/22/23):  This study is abnormal.  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  While she does not UPS-a ultrasound criteria for CIDP there is still sonographic evidence of diffuse and patchy mild-to-moderate nerve enlargement which can be seen in acquired demyelinating neuropathies .  Clinical note: Findings were reviewed with the patient and her husband who expressed understanding.  In the patient's clinical contact her above study could be consistent with a sensory predominant CIDP or other variant.     LUMBAR PUNCTURE (09/24/22)  OP could not be obtained due to slow CSF flow. 0 WBC, 0 RBC, Protein 33, Glucose 60.        ASSESSMENT/PLAN:    Sharronda Schweers Tayag is a 68 y.o. 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 Neurologic physical exam is grossly unchanged from February exam 2025 done in outpatient clinic and no focal neurological deficits. The examination findings do not localize to prior T5/T6 herniation or worsening peripheral nerve pathologies. Unable to witness muscle spasms during interview.  Labs and vitals are notable for hypertension (169/78). Patient's spasms reportedly responded well to IV Valium in the ED, favoring likely musculoskeletal vs. psychiatric. Given the history from the patient and diagnostic studies, the current  spasm episodes she is having are not related to her pre-existing neurologic concerns at this time. Possible differential diagnosis includes anxiety, somatic symptom disorder, GI pathology, cardiology etiology, musculoskeletal, or non-neurogenic spasm, or multifactorial as above.     Patients history of PTSD (in setting of parents dying in a plane crash when the pilot intentionally crashed the plane), and anxiety (on PTA Ativan  2mg ), patient may have a possible psychiatric component as well. She demonstrated emotional lability, fluctuating between superficial displays of whimpering and laughter. Psychiatric evaluation (outpatient or inpatient)  should be strongly considered for further evaluation if above medical work up has been performed/unremarkable and to aid in medically optimize PTSD and anxiety (possible functional neurologic pain). These diagnoses are strongly associated with functional pain disorders. ([email or web address])    Muscle Spasms, Cervicogenic:  - No acute indication for Neurology Admission or inpatient IVIG  - Recommend PO Baclofen 5mg  TID PRN for spasms  - Due to home PTA Ativan  2mg , will not add additional benzos to current regimen  - Recommend patient to follow up with OSH GI outpatient  - Can consider SLP bedside consult for swallow study/observation if patient reports she is unable to swallow  - Can Consider Pulm Mechanics One time NIF/FVC if concerned for respiratory concerns  - Can consider outpatient psychiatry to help with pain management, anxiety, PTSD  - Can consider possible outpatient PM&R referral for Trigger Point Injections if more musculoskeletal in nature    Concern for CIDP:  - continue with outpatient IVIG as planned  - Neurology team will provide her Neuromuscular Team an update    Atrial Fibrillation  Patient was recently diagnosed with atrial fibrillation and was advised to start Eliquis  5mg  BID at OSH, but did not start medication until  reviewing/have an in person conversation with her Primary Care Physician  - Continue Telemetry will in ED  - Advise to start Eliquis  5mg  BID    If this patient is admitted  to another service, please page [phone] (adult) or [phone] (peds) if you have continued questions and request neuro to continue following.     Thank you for this consult. We hope that we were able to assist in the care of this patient. This patient was staffed by phone with attending Dr. Torrance. Please contact Neurology Consult Resident [facility] [phone] with any additional questions.     Signe Engels, MD  [facility] Psychiatry  PGY-1  PIC [phone]    Damien Macadam, DO  PGY-2, [facility] Neurology  [employer]: [phone]    Attending Addendum:  Please see my amended note above.       I have personally discussed and reviewed the resident's note on 07/24/23.  I have personally reviewed Dr Jerral  history, physical exam, assessment and management plans.   I have personally reviewed the test data/images and I agree with the documented findings and interpretation. I concur with or have edited all elements of the resident/fellow note, except anything that may have been cut and pasted or carried forward by the author that I may have inadvertently missed.        Cesarina S. Torrance, MD  [phone]  [facility]

## 2023-08-16 ENCOUNTER — Ambulatory Visit (INDEPENDENT_AMBULATORY_CARE_PROVIDER_SITE_OTHER): Payer: Medicare Other | Admitting: Clinical Cardiac Electrophysiology

## 2023-10-22 ENCOUNTER — Ambulatory Visit (INDEPENDENT_AMBULATORY_CARE_PROVIDER_SITE_OTHER): Admitting: Clinical Cardiac Electrophysiology

## 2023-10-22 VITALS — BP 165/95 | HR 82 | Ht 67.5 in | Wt 156.6 lb

## 2023-10-22 DIAGNOSIS — I480 Paroxysmal atrial fibrillation: Secondary | ICD-10-CM

## 2023-10-22 DIAGNOSIS — I4891 Unspecified atrial fibrillation: Secondary | ICD-10-CM | POA: Insufficient documentation

## 2023-10-22 LAB — ECG 12-LEAD
Atrial Rate: 77 {beats}/min
IHS MUSE NARRATIVE AND IMPRESSION: NORMAL
P Axis: 56 degrees
P-R Interval: 152 ms
Q-T Interval: 372 ms
QRS Duration: 84 ms
QTC Calculation (Bezet): 420 ms
R Axis: 46 degrees
T Axis: 59 degrees
Ventricular Rate: 77 {beats}/min

## 2023-10-22 NOTE — Progress Notes (Signed)
 IMG ARRHYTHMIA NEW OFFICE CONSULTATION    I had the pleasure of seeing [name] today for outpatient cardiac electrophysiology consultation. She presents for evaluation of atrial fibrillation    HPI:     The patient is a 68 year old woman who underwent carotid endarterectomy in 2018.  Since then, she has had palpitations.  They have been getting worse over time.    Monitor in January 2023 unremarkable.  Monitor in July 2023 showed 20 seconds of atrial tachycardia.  Monitor January 2025 showed a fibrillation atrial flutter burden of 5%.  Longest event 39 minutes.  Some of it looks regularized, consistent with an atrial flutter.  Spontaneous onset of atrial fibrillation is seen.  There was 1 episode of an atrial tachycardia, but only 106 bpm for 20 seconds.    This year, she has gone to the emergency room perhaps 6 times since January for palpitations and elevated blood pressure.  She was placed on metoprolol.  She was getting palpitations in the afternoon, increased to twice daily.  Subsequently changed to atenolol.  She was getting nightmares.  Subsequently changed to Cardizem.    Echocardiogram January 2025 showed ejection fraction 55 to 60%, normal thickness.  Nuclear stress test February 2022 showed a hypertensive response, no ischemia.    CT of the abdomen March 2025 showed normal adrenals.    About 3 to 4 weeks ago, she injured her neck and back.  She is in considerable pain.  She has been seen at the [facility]  spine clinic.  She was admitted twice for pain management.    She is getting palpitations every day.  They can last 20 minutes.  In association with these events, she is uncomfortable, feels palpitations, has had lightheadedness and dizziness, and feels anxious.    Other medical history notable for chronic demyelinating polyneuropathy.  She had had prior COVID vaccinations, and this started after a combined COVID and flu vaccination.  She has received intravenous IVIG at [facility] in March, the  third infusion had to be discontinued due to infiltration, hypertension, and it is taken a couple months to get over the itching.    History of positive ANA, and she was told decades ago that she had Sjogren's.    She was being seen by another electrophysiologist,.  The patient states that she was told that she was not a candidate for ablation    Patient Active Problem List    Diagnosis Date Noted    AF (atrial fibrillation) (CMS/HCC) 10/22/2023    Carotid stenosis, asymptomatic, right 05/16/2016        MEDICATIONS:     Current Medications[1]     SH: Social History[2]    FH: no family history of sudden death    REVIEW OF SYSTEMS: All other systems reviewed and negative except as above.    PHYSICAL EXAMINATION  General Appearance: A well-appearing female in no acute distress.   Vital Signs: BP (!) 165/95 (BP Site: Left arm, Patient Position: Sitting, Cuff Size: Medium)   Pulse 82   Ht 1.715 m (5' 7.5)   Wt 71 kg (156 lb 9.6 oz)   BMI 24.17 kg/m    HEENT: Sclera anicteric, conjunctiva without pallor, moist mucous membranes, normal dentition.   Neck: Supple without jugular venous distention. Thyroid nonpalpable. Normal carotid upstrokes without bruits.  Chest: Clear to auscultation bilaterally with good air movement and respiratory effort and no wheezes, rales, or rhonchi  Cardiovascular: Normal S1 and physiologically split S2 without murmurs, gallops  or rub. PMI of normal size and nondisplaced.   Abdomen: Soft, nontender. No organomegaly.  No pulsatile masses or bruits.    Extremities: Warm without edema. All peripheral pulses are full and equal.  Skin: No rash, xanthoma or xanthelasma.   Neuro: Alert and oriented x3. Grossly intact. Strength is symmetrical. Normal mood and affect.     ECG: normal sinus rhythm at 77 bpm, normal axis and intervals, no significant ST/T changes.  Possible old anterior MI.    Cardiac Diagnostics: As above.      IMPRESSION/RECOMMENDATIONS: Audrey Gilmore is a 68 y.o. female who presents  for outpatient consultation for evaluation of paroxysmal atrial fibrillation.    We discussed the difference between atrial fibrillation and flutter.  She was told by another electrophysiologist that she was not a candidate for ablation.    We discussed options for treatment.  We discussed antiarrhythmic therapy versus ablation.  We discussed medications that require admission to the hospital, those that do not.  We specifically discussed amiodarone, potential side effects, and the frequency with which this occurs.  We discussed Tikosyn, and that it would require admission to the hospital.    At this point, her principal problem is pain.  She has declined to be on anticoagulation, because she is concerned about pain.  Therefore, would not pursue ablation until pain issues have been addressed, and she is willing to be on anticoagulation.    The question at this point is whether to start medication at all.  After much discussion, she would like to wait.    Follow-up in 3 months.    Total time spent with patient 75 minutes of which greater than 50% was spent on patient education, counseling, and options.           [1]   Current Outpatient Medications   Medication Sig Dispense Refill    aspirin  EC 81 MG EC tablet Take 1 tablet (81 mg) by mouth every other day      Cholecalciferol 50 MCG (2000 UT) Tab Take 2,000 Units by mouth      dilTIAZem (CARDIZEM CD) 120 MG 24 hr capsule Take 1 capsule (120 mg) by mouth      Lactobacillus Rhamnosus, GG, (CULTURELLE PO) Take 1 capsule by mouth daily      LORazepam  (ATIVAN ) 0.5 MG tablet Take 1 tablet (0.5 mg) by mouth daily      Multiple Vitamins-Minerals (multivitamin with minerals) tablet Take 1 tablet by mouth daily      traMADol  (ULTRAM ) 50 MG tablet TAKE 2 TABLETS BY MOUTH EVERY 8 HOURS   Takes BID  1     No current facility-administered medications for this visit.   [2]   Social History  Tobacco Use    Smoking status: Former     Current packs/day: 0.00     Types: Cigarettes      Start date: 05/31/1975     Quit date: 05/31/1983     Years since quitting: 40.4    Smokeless tobacco: Never   Substance Use Topics    Alcohol use: Yes     Comment: < weekly basis    Drug use: No

## 2023-12-25 ENCOUNTER — Ambulatory Visit: Attending: Neurology | Admitting: Neurology

## 2023-12-25 ENCOUNTER — Encounter: Payer: Self-pay | Admitting: Neurology

## 2023-12-25 DIAGNOSIS — R270 Ataxia, unspecified: Secondary | ICD-10-CM | POA: Insufficient documentation

## 2023-12-25 DIAGNOSIS — G6181 Chronic inflammatory demyelinating polyneuritis: Secondary | ICD-10-CM | POA: Insufficient documentation

## 2023-12-25 NOTE — Patient Instructions (Signed)
 I have ordered imaging today.   Please call and schedule an imaging appointment at [facility] [phone].

## 2023-12-25 NOTE — Progress Notes (Signed)
 [place] NEUROLOGY HISTORY AND PHYSICAL EXAMINATION      Patient Name: Audrey Gilmore  MRN: [id number]  Primary Care MD: Valley Laquetta LABOR, MD Phone: [phone]  Date: 12/25/2023 Time: 4:12 PM      HPI: Audrey Gilmore is a 68 y.o. 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 She was referred by her primary care doctor for further evaluation of her symptoms.    She was diagnosed with CIDP about a year ago after experiencing symptoms following a flu shot and bivalent COVID vaccine in November 2022. She had received four or five previous COVID vaccines without issue. Within 24 hours of the flu shot, she experienced tingling in her legs, which progressed over six months to include her hands, face, and torso. Her symptoms have continued to worsen, with a sensation of walking on pebbles and a band of pressure around her forefoot.    She underwent a workup at [facility], including nerve conduction studies and EMG, which were abnormal. A spinal tap was performed with difficulty, but protein levels were obtained. She was concerned about an MRI with contrast due to a past fluoroquinolone injury that affected her tendons and cartilage. A neuro ultrasound at [facility] showed nerve involvement.    She began IVIG therapy in March 2025, with four scheduled infusions. The first infusion was well-tolerated, but the second caused discomfort, and the third resulted in an infiltration, high blood pressure (200/100), and dizziness, leading to an ER visit and cancellation of the fourth infusion. Post-infusion, she experienced intense itching following oral intake, which persisted for two and a half months before resolving.    Her current symptoms include severe sensory disturbances, a constant feeling of being on a rocking boat, and significant balance issues. She has experienced multiple falls, resulting in injuries such as  avulsion fractures in her ankle and a tibia fracture. She describes patchy numbness and tingling in her hands and feet, with her feet feeling hot at night. She wears shoes at all times to prevent injury.    She has a history of tinnitus, which worsened after the onset of her current symptoms. She also reports memory issues and short-term memory loss. Her weight has been stable, with a recent slight increase possibly due to water retention. She has a history of hypoglycemia but no diabetes, despite having large babies and negative gestational diabetes tests.      Past Medical History:   Diagnosis Date    Arthritis     Atrial fibrillation (CMS/HCC) 2024    Atrial flutter (CMS/HCC) 2018    after carotid endarterectomy    Awareness under anesthesia 2018    during carotid endarterectomy I could feel pain    Balance problem     Bursitis     Carotid stenosis 2018    right    Carotid stenosis, asymptomatic, right 05/16/2016    Chronic pain     Follows w/Pain Management for many years    Claustrophobia ?    Complication of anesthesia     severe nausea    Coronary artery disease 2018    carotid artery endarterectomy    Dizziness ?    Dysphagia     Gastroesophageal reflux disease     Headache     Hearing loss     Hiatal hernia     Hypercholesterolemia  Hypertension 2018    able blood pressure    Low back pain     Memory loss ?    came on gradually over the years    Neuropathy     neuropathy    Numbness     Osteopenia     Pain     Palpitations     Due to dehydration/increased caffeine intake    Paroxysmal supraventricular tachycardia ?    Paroxysmal SVT (supraventricular tachycardia)     Last episode ~1992    Paroxysmal ventricular tachycardia (CMS/HCC) ?    Peripheral arterial disease     dibt remember    Pins and needles sensation     Post-operative nausea and vomiting     Weakness        Past Surgical History[1]    Medications:  Medications Ordered Prior to Encounter[2]      Allergies: Allergies[3]      PHYSICAL  EXAM:  There were no vitals taken for this visit.      NEUROLOGICAL EXAM:  General: Alert and oriented to person, date, location. Fund of knowledge is appropriate to age/circumstance.  Mood and affect are appropriate. Attention is intact.  Language function including fluency, naming, repetition, comprehension intact.    Cranial nerves: Pupils are equal round reactive to light and accommodation. Ocular motility is normal, without nystagmus. Facial strength and activation are normal bilaterally. No dysarthria.Tongue and uvula are midline and palate elevates symmetrically.   Trapezius and sternocleidomastoid strength are 5/5.    Motor Exam:  No pronator drift. Bulk and tone are normal. No atrophy or fasciculations.     Strength is as follows, R/L    Deltoid: 5/5  Infraspinatus: 5/5  Triceps: 5/5  Biceps: 5/5  Wrist extensor: 5/5  Wrist flexor: 5/5  Finger extensor: 5/5  Finger flexor proximal: 5/5  Finger flexor distal: 5/5  Hip flexion: 5/5  Leg extension: 5/5  Leg flexion: 5/5  Foot dorsiflexion: 5/5  Foot plantar flexion: 5/5    Deep tendon reflexes:   Biceps 3+, triceps 3+, brachioradialis 3+, patellar 2+, Achilles 2+ bilaterally.    Sensation: Decrement to pinprick and vibratory sensation in distal upper extremities and distal lower extremities bilaterally.     Gait: negative romberg. Unsteady gait, but not wide-based. Unsteady turns.     LAB & TEST RESULTS:  LUMBAR PUNCTURE (09/24/22)  OP could not be obtained due to slow CSF flow. 0 WBC, 0 RBC, Protein 33, Glucose 60.    Labs (many at [facility] ):  - 2021: +ANA x2, Lyme neg, ANCA neg, anti-SS neg  - 2022: vitamin B12 336, MMA 197 (wnl), SPEP wnl, folate 23, TSH 3.53, vitamin D 65,  LDL 113, cholesterol 187, triglycerides 99  - 05/2021: vitamin D 71, ESR 4, CRP <0.5, CBC wnl, CMP wnl  - 10/2021: Mg 2, thiamine 130 (wnl), pyridoxine 50 (upper limit of nl), +ANA w/homogenous pattern (1:80 titer), ACE wnl, Cu 130 (wnl), serum Ig ratios wnl, HBsAg/HBcAb neg, HCV Ab  neg  - 10/2022: A1c 5.6, SPEP/Ig quant normal, Light-chain with minimal kappa elevation but ratio WNL, ANA negative, ENA negative, CMT [facility] panel negative, [facility] demyelinating panel negative  - 06/2023: SPEP nl; Kappa light chain elevated but ratio normal    IMAGES:   Imaging (summarized radiology interpretations, all from [facility] ):  - MRI brain w/o contrast (06/20/2011): done for headache and unsteady gait; minimal white matter changes  - MRI brain w/o contrast (09/08/2016): done for possible stroke during  CEA; stable  - MRI brain w/o contrast (12/07/2020): moderate pontine white matter lesions & mild supratentorial white matter intensities with chronic small vessel changes     - MRA neck w/contrast (12/2011): mild proximal R ICA stenosis (20-30%)  - MRA neck w/contrast (04/2016): severe proximal R ICA stenosis (90%), no proximal L ICA stenosis  - Carotid US  (05/2021): mild L ICA origin stenosis (1-49%); R ICA open s/p CEA     - MRI C-spine w/o contrast (12/04/2020): no central stenosis; moderate narrowing of L neural foramen at C3-C4; mild narrowing at R neural foramen at C5-C6  - MRI C-spine w/o contrast (10/13/2021): mild to moderate DDD with nl cord & no major changes since prior study, noting severe left foraminal stenosis at C3-C4 and mild bilateral foraminal narrowing at C4-C5 through C6-C7  - MRI Cspine 09/2023: Mild degenerative disc disease of the cervical spine. No significant central spinal canal stenosis. Normal-appearing cervical spinal cord.   Severe left foraminal stenosis at C3-C4 due to uncovertebral hypertrophy. Minimal left foraminal narrowing at C4-C5 and mild bilateral foraminal narrowing at C5-C6. Overall findings are not significantly changed from the previous study.      - MRI T-spine w/o contrast (10/2011): mild DDD; no significant central or foraminal narrowing  - MRI T-spine w/o contrast (11/2020): new small disc herniation at T6-T7 compared to 2013 study; slightly increased degenerative  changes (moderate spondylosis most prominent at T6-T7 through T8-T9)  - MRI T-spine w/o contrast (10/2021): stable from prior; marrow edema around anterior aspects of T6-T7 through T8-T9 compatible with DDD  - MRI T-spine 08/2023: Stable mild degenerative changes without significant canal or neural foraminal stenosis. No abnormal cord signal identified      - MRI L-spine w/o contrast (11/2011): moderate central canal stenosis & moderate bilateral neural foraminal narrowing at L4-L5 related to mild disc bulge & marked ligamentum flavum hypertrophy; otherwise scattered degenerative changes without significant narrowing  - MRI L-spine w/o contrast (06/2014): mild progressive of degenerative changes most prominent at L4-L5    OTHER TESTS:  - EMG/NCS ([facility]) (03/21/2020): performed on arms, dx mild R carpal tunnel syndrome, R radial sensory neuropathy, & 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 Per EFNS motor nerve criteria, the significant conduction velocity slowing (>=30% below LLN) in the median and ulnar CMAPs is supportive of demyelinating neuropathy. Also per EFNS sensory nerve criteria, the abnormal radial and ulnar SNAPs is supportive of demyelinating neuropathy. The presence of partial conduction block suggests acquired demyelination, although there is no clear temporal dispersion so this may be a hereditary demyelinating polyneuropathy if the apparent partial conduction is technical in nature.   - NCS/US  [facility]) (05/22/23):  This study is abnormal.  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  While she does not UPS-a ultrasound criteria for CIDP there is still sonographic evidence of diffuse and patchy mild-to-moderate nerve enlargement which can be seen in  acquired demyelinating neuropathies .  Clinical note: Findings were reviewed with the patient and her husband who expressed understanding.  In the patient's clinical contact her above study could be consistent with a sensory predominant CIDP or other variant.      ASSESSMENT AND PLAN:  Keiko Myricks Hasz is a 67 y.o. female     Assessment & Plan  Chronic inflammatory demyelinating polyneuropathy (CIDP), suspected, with peripheral  neuropathy  Suspected CIDP, onset after flu and COVID vaccines in 2022. Symptoms include sensory disturbances, tingling, and balance issues. EMG and nerve conduction studies show significant abnormalities. Differential includes CIDP and potential central nervous system involvement. Concerns about adverse reactions to treatments like IVIG, due to prior reactions of HTN and possible allergic response; and prednisone due to risk of arrhythmias. She expresses fear of Vygart.   - Order EMG to assess nerve function  - Order MRI of the brain without contrast to evaluate for central nervous system involvement  - Refer to physical therapy for balance and gait training    Gait instability and recurrent falls  Severe gait instability with recurrent falls, possibly related to peripheral neuropathy or a separate central issue. Symptoms include feeling wobbly, frequent falls, and sensory changes in feet and hands. Differential includes CIDP-related instability and potential central nervous system causes. She has experienced multiple falls resulting in injuries, including fractures.  - Order MRI of the brain without contrast to evaluate for central nervous system involvement  - Refer to physical therapy for balance and gait training    History of adverse reaction to intravenous immunoglobulin (IVIG)  Adverse reaction to IVIG, including hypertension and dizziness, leading to discontinuation of treatment. Concerns about alternative treatments like Vygart and prednisone due to potential side effects and  arrhythmias. She experienced severe itching following IVIG, which resolved after two and a half months.  - Discuss alternative therapies for CIDP, considering her concerns about side effects        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)    -  Primary    Relevant Orders    EMG    Referral to Physical Therapy - EXTERNAL      Ataxia        Relevant Orders    MRI Brain WO Contrast    Referral to Physical Therapy - EXTERNAL                I have spent 30 minutes reviewing the record, interviewing and examining the patient, and documenting the encounter.     Rolan Rubins MD, JD, FAAN         [1]   Past Surgical History:  Procedure Laterality Date    CARDIAC CATHETERIZATION  2012    CAROTID ENDARTERECTOMY  2018    CHOLECYSTECTOMY      COLONOSCOPY, DIAGNOSTIC (SCREENING)      ENDARTERECTOMY, CAROTID ARTERY Right 06/11/2016    Procedure: ENDARTERECTOMY, CAROTID ARTERY WITH PATCH ANGIOPLASTY;  Surgeon: Aurelio Arvis LABOR, MD;  Location: [facility] MAIN OR;  Service: Cardiovascular;  Laterality: Right;    EPIDURAL STEROID INJECTION      knees, scapula, frozen shoulder, thumb    HYSTERECTOMY      TONSILLECTOMY      TUBAL LIGATION     [2]   Current Outpatient Medications on File Prior to Visit   Medication Sig Dispense Refill    aspirin  EC 81 MG EC tablet Take 1 tablet (81 mg) by mouth every other day      Cholecalciferol 50 MCG (2000 UT) Tab Take 2,000 Units by mouth      dilTIAZem (CARDIZEM CD) 120 MG 24 hr capsule Take 1 capsule (120 mg) by mouth      Lactobacillus Rhamnosus, GG, (CULTURELLE PO) Take 1 capsule by mouth daily      LORazepam  (ATIVAN )  0.5 MG tablet Take 1 tablet (0.5 mg) by mouth daily      Multiple Vitamins-Minerals (multivitamin with minerals) tablet Take 1 tablet by mouth daily      traMADol  (ULTRAM ) 50 MG tablet TAKE 2 TABLETS BY MOUTH EVERY 8 HOURS   Takes BID  1    VITAMIN B1-B12 IJ Inject as directed        No current facility-administered medications on file prior to visit.   [3]   Allergies  Allergen Reactions    Bactrim [Sulfamethoxazole-Trimethoprim] Hives and Itching    Cephalexin Itching    Cephalosporins Hives and Itching    Ciprofloxacin Other (See Comments)     Severe joint pain    Dye [Iodinated Contrast Media] Itching and Other (See Comments)     Sneezing, itching    Levaquin [Levofloxacin] Other (See Comments)     Severe joint pain    Lipitor [Atorvastatin] Other (See Comments)     Muscle ache/spasms/cramps  Headache    Lisinopril Swelling    Lidocaine  Palpitations

## 2024-01-16 ENCOUNTER — Telehealth: Payer: Self-pay | Admitting: Neurology

## 2024-01-16 NOTE — Telephone Encounter (Signed)
 Copied from CRM #[id number]. Topic: Clinical Support - Speak With Nurse  >> Jan 16, 2024  1:34 PM L'Thanya R wrote:  Coke, Audrey Gilmore called about Clinical Support - Speak With Nurse.  Additional details:patient would like to advise   Dr.Larriviere that when she went to have her MRI Brain done today she was advised of having clamps in her body and she was not able to have this done and she says that the MRI tech at the [facility] imaging center she would like to have this done in   [place] where she lives at this time and if this could be taken care of [phone]

## 2024-01-17 ENCOUNTER — Telehealth (INDEPENDENT_AMBULATORY_CARE_PROVIDER_SITE_OTHER): Payer: Self-pay

## 2024-01-17 NOTE — Telephone Encounter (Signed)
 Pt called [facility] stating she is trying to get MRI of brain done and tech at [facility] told pt she has 'clamps in her body' from her CEA surgery in 2018 w/ Dr Aurelio. Pt stated she told the tech she has had MRI's since that surgery without issue. Pt is requesting a letter from our office stating she does not have 'clamps' in her and ok to proceed w/ MRI's. Letter placed into Epic and pt was notified to look in communications tab under letters

## 2024-01-29 ENCOUNTER — Ambulatory Visit (INDEPENDENT_AMBULATORY_CARE_PROVIDER_SITE_OTHER): Admitting: Clinical Cardiac Electrophysiology

## 2024-01-29 ENCOUNTER — Other Ambulatory Visit (INDEPENDENT_AMBULATORY_CARE_PROVIDER_SITE_OTHER): Payer: Self-pay

## 2024-01-29 VITALS — BP 145/82 | HR 88 | Ht 67.5 in | Wt 156.0 lb

## 2024-01-29 DIAGNOSIS — Z01818 Encounter for other preprocedural examination: Secondary | ICD-10-CM

## 2024-01-29 DIAGNOSIS — I4891 Unspecified atrial fibrillation: Secondary | ICD-10-CM

## 2024-01-29 LAB — ECG 12-LEAD
Atrial Rate: 352 {beats}/min
P Axis: 207 degrees
Q-T Interval: 346 ms
QRS Duration: 78 ms
QTC Calculation (Bezet): 418 ms
R Axis: 69 degrees
T Axis: 54 degrees
Ventricular Rate: 88 {beats}/min

## 2024-01-29 MED ORDER — APIXABAN 5 MG PO TABS
5.0000 mg | ORAL_TABLET | Freq: Two times a day (BID) | ORAL | 3 refills | Status: AC
Start: 2024-01-29 — End: ?

## 2024-01-29 NOTE — Progress Notes (Signed)
 [facility] ARRHYTHMIA OFFICE VISIT    I had the pleasure of seeing Ms. Hutchinson today for arrhythmia follow up.      The patient is a 68 year old woman whom we met in June.  History of endarterectomy 2018, subsequent palpitations.    Multiple previous monitors, butMonitor January 2025 showed a fibrillation atrial flutter burden of 5%. Longest event 39 minutes. Some of it looks regularized, consistent with an atrial flutter. Spontaneous onset of atrial fibrillation is seen.     Echocardiogram January 2025 showed ejection fraction 55 to 60%, normal thickness. Nuclear stress test February 2022 showed a hypertensive response, no ischemia.     In June, rhythm was sinus.  We discussed options.  She declined anticoagulation, she was most concerned about the pain issues.  In May, injury to neck and back.  She did not Annagrace Carr to start cardiac medication.    Since then, she believes that she is probably having events more frequently.  She estimates that she is probably in atrial fibrillation 50% of the time, not sure about currently.  She has gained weight.    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  She was going to get a back injection, and it was canceled due to contrast allergy.    She is uncomfortable from the atrial fibrillation, uncomfortable for other issues as well    PMH: Medical History[1]     MEDICATIONS: Current Medications[2]     Meds reviewed, no changes since last visit.    SH: Social History[3]    FH: no history of sudden death.  Family History reviewed and is otherwise not pertinent    REVIEW OF SYSTEMS: All other systems reviewed and negative except as above.    PHYSICAL EXAMINATION  General Appearance: A well-appearing female in no acute distress.   Vital Signs: BP 145/82 (BP Site: Left arm, Patient Position: Sitting, Cuff Size: Medium)   Pulse 88   Ht 1.715 m (5' 7.5)   Wt 70.8 kg (156 lb)   BMI 24.07 kg/m    Neck: Supple without jugular venous distention. Thyroid  nonpalpable. Normal carotid upstrokes without bruits.  Chest: Clear to auscultation bilaterally with good air movement and respiratory effort and no wheezes, rales, or rhonchi  Cardiovascular: Normal S1 and physiologically split S2 without murmurs, gallops or rub. PMI of normal size and nondisplaced.       ECG: Atrial flutter with a controlled ventricular rate, 88 bpm.  .      IMPRESSION/RECOMMENDATIONS: Ms. Taft is a 68 y.o. female who presents for follow up.      In January, burden on monitor was 5%.  She now estimates that she is in abnormal rhythm 50%.  Even if not that high, she is currently in atrial flutter.  We do not know how long this episode has been ongoing.    We again discussed options of rate control and anticoagulation, antiarrhythmic therapy, versus ablation.  We should start anticoagulation, and she is amenable to do so.  We discussed that we cannot pursue any treatment for atrial fibrillation without TEE or being on anticoagulation for at least 4 weeks.  She is comfortable with the latter.    I again discussed with her the potential risks of AF ablation, statistics with regard to its success.  I have introduced her to Dr. Diona.    Ablation Scheduling Addendum:   Procedure:  AFIB PAROXYSMAL and Flutter  Mapping:  Per [facility]  Anesthesia Plan:  GENERAL  Pre-Op Imaging:  NONE  Discharge Plan:  Discharge Plan: SAME DAY DISCHARGE  Location/Info:   [facility]    Medication Instructions:  Anticoagulants:  Continue Eliquis /Pradaxa/Xarelto   Antiarrhythmics:  na    Oliva Foil, MD, [facility], [facility]  [facility] Arrhythmia  Spectralink x [phone]  [phone]         [1]   Past Medical History:  Diagnosis Date    Arthritis     Atrial fibrillation (CMS/HCC) 2024    Atrial flutter (CMS/HCC) 2018    after carotid endarterectomy    Awareness under anesthesia 2018    during carotid endarterectomy I could feel pain    Balance problem     Bursitis     Carotid stenosis 2018    right    Carotid stenosis, asymptomatic, right 05/16/2016     Chronic pain     Follows w/Pain Management for many years    Claustrophobia ?    Complication of anesthesia     severe nausea    Coronary artery disease 2018    carotid artery endarterectomy    Dizziness ?    Dysphagia     Gastroesophageal reflux disease     Headache     Hearing loss     Hiatal hernia     Hypercholesterolemia     Hypertension 2018    able blood pressure    Low back pain     Memory loss ?    came on gradually over the years    Neuropathy     neuropathy    Numbness     Osteopenia     Pain     Palpitations     Due to dehydration/increased caffeine intake    Paroxysmal supraventricular tachycardia ?    Paroxysmal SVT (supraventricular tachycardia)     Last episode ~1992    Paroxysmal ventricular tachycardia (CMS/HCC) ?    Peripheral arterial disease     dibt remember    Pins and needles sensation     Post-operative nausea and vomiting     Weakness    [2]   Current Outpatient Medications   Medication Sig Dispense Refill    aspirin  EC 81 MG EC tablet Take 1 tablet (81 mg) by mouth every other day      Cholecalciferol 50 MCG (2000 UT) Tab Take 2,000 Units by mouth      dilTIAZem (CARDIZEM CD) 120 MG 24 hr capsule Take 1 capsule (120 mg) by mouth      Lactobacillus Rhamnosus, GG, (CULTURELLE PO) Take 1 capsule by mouth daily      LORazepam  (ATIVAN ) 0.5 MG tablet Take 1 tablet (0.5 mg) by mouth daily      Multiple Vitamins-Minerals (multivitamin with minerals) tablet Take 1 tablet by mouth daily      traMADol  (ULTRAM ) 50 MG tablet TAKE 2 TABLETS BY MOUTH EVERY 8 HOURS   Takes BID  1    VITAMIN B1-B12 IJ Inject as directed       No current facility-administered medications for this visit.   [3]   Social History  Tobacco Use    Smoking status: Former     Current packs/day: 0.00     Types: Cigarettes     Start date: 05/31/1975     Quit date: 05/31/1983     Years since quitting: 40.6    Smokeless tobacco: Never   Substance Use Topics    Alcohol use: Not Currently     Comment: < weekly basis    Drug use:  Never

## 2024-02-06 ENCOUNTER — Encounter: Payer: Self-pay | Admitting: Neurology

## 2024-02-10 ENCOUNTER — Ambulatory Visit: Admitting: Neurology

## 2024-02-13 ENCOUNTER — Ambulatory Visit: Admitting: Neurology

## 2024-02-17 ENCOUNTER — Encounter: Payer: Self-pay | Admitting: Neurology

## 2024-02-17 ENCOUNTER — Ambulatory Visit: Attending: Neurology | Admitting: Neurology

## 2024-02-17 VITALS — Ht 67.5 in

## 2024-02-17 DIAGNOSIS — R270 Ataxia, unspecified: Secondary | ICD-10-CM | POA: Insufficient documentation

## 2024-02-17 DIAGNOSIS — G6181 Chronic inflammatory demyelinating polyneuritis: Secondary | ICD-10-CM | POA: Insufficient documentation

## 2024-02-17 DIAGNOSIS — H8190 Unspecified disorder of vestibular function, unspecified ear: Secondary | ICD-10-CM | POA: Insufficient documentation

## 2024-02-17 NOTE — Progress Notes (Signed)
 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    She experiences intense dizziness, described as feeling like walking on a moving floor. This sensation is constant, with varying intensity, and is present even when sitting still. Movement, such as turning her head or picking up objects, exacerbates the sensation. The dizziness began before starting IVIG treatment and has worsened over time. She has experienced falls, including one from standing height while chasing a squirrel, which she attributes to the sensation of the ground moving beneath her.    She describes a twitch in her right eye, which she feels is like a twitch in her eyeball, and has noticed it for the last couple of days. Additionally, she experienced a wavy sensation in her peripheral vision with some colors, lasting a few minutes, which occurred twice without associated headaches. No nausea is reported, but she sometimes notices her eyes moving, causing the world to appear as if it is jumping around.    She reports a history of tremors in her hands for over a year, which were alleviated by beta blockers, but she could not tolerate the medication. She denies any family history of tremors but mentions her brother had an episode of Guillain-Barre after a Repatha shot and experiences similar tingling sensations.    She describes a progression of symptoms from tingling to pins and needles, and now sharp pains, which she experiences in her hands, ribs, left side, breast, hip, feet, and legs. She sometimes feels like she cannot feel her feet and legs when standing after sitting for a while. She also reports a sensation of a rope around her neck and  difficulty swallowing, which she feels is worsening.    She has a history of hypoglycemia, which has been more erratic recently, causing her to feel super shaky. She mentions a chronic dry cough and low sodium levels, which have been consistent for at least six months. She is concerned about the low sodium but has not been informed of any specific investigations into this issue.          HPI: Audrey Gilmore is a 68 y.o. 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 She was referred by her primary care doctor for further evaluation of her symptoms.    She was diagnosed with CIDP about a year ago after experiencing symptoms following a flu shot and bivalent COVID vaccine in November 2022. She had received four or five previous COVID vaccines without issue. Within 24 hours of the flu shot, she experienced tingling in her legs, which progressed over six months to include her hands, face, and torso. Her symptoms have continued to worsen, with a sensation of walking on pebbles and a band of pressure around her forefoot.    She underwent a workup at [facility], including nerve conduction studies and EMG, which were abnormal. A spinal tap was performed with difficulty, but protein levels were obtained and were normal. She was concerned about an MRI with contrast due to a past fluoroquinolone injury that affected her tendons and cartilage. A neuro ultrasound at [facility] showed nerve involvement.    She began IVIG therapy in March 2025, with four scheduled infusions. The first infusion was well-tolerated, but the second caused discomfort, and the third resulted in an infiltration, high blood pressure (200/100), and dizziness, leading to an ER visit and cancellation of the fourth infusion. Post-infusion, she experienced intense itching following oral intake, which persisted for two and a half months before resolving.    Her current symptoms  include severe sensory disturbances, a constant feeling of being on a rocking boat, and significant balance issues. She has experienced multiple falls, resulting in injuries such as avulsion fractures in her ankle and a tibia fracture. She describes patchy numbness and tingling in her hands and feet, with her feet feeling hot at night. She wears shoes at all times to prevent injury.    She has a history of tinnitus, which worsened after the onset of her current symptoms. She also reports memory issues and short-term memory loss. Her weight has been stable, with a recent slight increase possibly due to water retention. She has a history of hypoglycemia but no diabetes, despite having large babies and negative gestational diabetes tests.      Past Medical History:   Diagnosis Date    Arthritis     Atrial fibrillation (CMS/HCC) 2024    Atrial flutter (CMS/HCC) 2018    after carotid endarterectomy    Awareness under anesthesia 2018    during carotid endarterectomy I could feel pain    Balance problem     Bursitis     Carotid stenosis 2018    right    Carotid stenosis, asymptomatic, right 05/16/2016    Chronic pain     Follows w/Pain Management for many years    Claustrophobia ?    Complication of anesthesia     severe nausea    Coronary artery disease 2018    carotid artery endarterectomy    Dizziness ?    Dysphagia     Gastroesophageal reflux disease     Headache     Hearing loss     Hiatal hernia     Hypercholesterolemia     Hypertension 2018    able blood pressure    Low back pain     Memory loss ?    came on gradually over the years    Neuropathy     neuropathy    Numbness     Osteopenia     Pain     Palpitations     Due to dehydration/increased caffeine intake    Paroxysmal supraventricular tachycardia ?    Paroxysmal SVT (supraventricular tachycardia)     Last episode ~1992    Paroxysmal ventricular tachycardia (CMS/HCC) ?    Peripheral arterial disease     dibt remember    Pins and needles sensation      Post-operative nausea and vomiting     Weakness        Past Surgical History[1]    Medications:  Medications Ordered Prior to Encounter[2]      Allergies: Allergies[3]      PHYSICAL EXAM:  Ht 1.715 m (5' 7.5)   BMI 24.07 kg/m       NEUROLOGICAL EXAM:  General: Alert and oriented to person, date, location. Fund of knowledge is appropriate to age/circumstance.  Mood and affect are appropriate. Attention is intact.  Language function including fluency, naming, repetition, comprehension intact.    Cranial nerves: Pupils are equal round reactive to light and accommodation.  Ocular motility is normal, without nystagmus. Facial strength and activation are normal bilaterally. No dysarthria.Tongue and uvula are midline and palate elevates symmetrically.   Trapezius and sternocleidomastoid strength are 5/5.    Motor Exam:  No pronator drift. Bulk and tone are normal. No atrophy or fasciculations.     Strength is as follows, R/L    Deltoid: 5/5  Infraspinatus: 5/5  Triceps: 5/5  Biceps: 5/5  Wrist extensor: 5/5  Wrist flexor: 5/5  Finger extensor: 5/5  Finger flexor proximal: 5/5  Finger flexor distal: 5/5  Hip flexion: 5/5  Leg extension: 5/5  Leg flexion: 5/5  Foot dorsiflexion: 5/5  Foot plantar flexion: 5/5    Deep tendon reflexes:   Biceps 3+, triceps 3+, brachioradialis 3+, patellar 2+, Achilles 2+ bilaterally.    Sensation: Decrement to pinprick and vibratory sensation in distal upper extremities and distal lower extremities bilaterally.     Gait: negative romberg. Unsteady gait, but not wide-based. Unsteady turns.     LAB & TEST RESULTS:  LUMBAR PUNCTURE (09/24/22)  OP could not be obtained due to slow CSF flow. 0 WBC, 0 RBC, Protein 33, Glucose 60.    Labs (many at [facility] [place] ):  - 2021: +ANA x2, Lyme neg, ANCA neg, anti-SS neg  - 2022: vitamin B12 336, MMA 197 (wnl), SPEP wnl, folate 23, TSH 3.53, vitamin D 65,  LDL 113, cholesterol 187, triglycerides 99  - 05/2021: vitamin D 71, ESR 4, CRP <0.5, CBC wnl, CMP  wnl  - 10/2021: Mg 2, thiamine 130 (wnl), pyridoxine 50 (upper limit of nl), +ANA w/homogenous pattern (1:80 titer), ACE wnl, Cu 130 (wnl), serum Ig ratios wnl, HBsAg/HBcAb neg, HCV Ab neg  - 10/2022: A1c 5.6, SPEP/Ig quant normal, Light-chain with minimal kappa elevation but ratio WNL, ANA negative, ENA negative, CMT [facility] panel negative, [facility] demyelinating panel negative  - 06/2023: SPEP nl; Kappa light chain elevated but ratio normal    IMAGES:   Imaging (summarized radiology interpretations, all from [facility]  ):  - MRI brain w/o contrast (06/20/2011): done for headache and unsteady gait; minimal white matter changes  - MRI brain w/o contrast (09/08/2016): done for possible stroke during CEA; stable  - MRI brain w/o contrast (12/07/2020): moderate pontine white matter lesions & mild supratentorial white matter intensities with chronic small vessel changes  - MRI brain w/o contrast (9.2025): Stable bilateral white matter signal abnormalities and pontine signal abnormality, which is favored to represent mild-to-moderate chronic ischemic small vessel white matter disease in this patient age group.      - MRA neck w/contrast (12/2011): mild proximal R ICA stenosis (20-30%)  - MRA neck w/contrast (04/2016): severe proximal R ICA stenosis (90%), no proximal L ICA stenosis  - Carotid US  (05/2021): mild L ICA origin stenosis (1-49%); R ICA open s/p CEA     - MRI C-spine w/o contrast (12/04/2020): no central stenosis; moderate narrowing of L neural foramen at C3-C4; mild narrowing at R neural foramen at C5-C6  - MRI C-spine w/o contrast (10/13/2021): mild to moderate DDD with nl cord & no major changes since prior study, noting severe left foraminal stenosis at C3-C4 and mild bilateral foraminal narrowing at C4-C5 through C6-C7  - MRI Cspine 09/2023: Mild degenerative disc disease of the cervical spine. No significant central spinal canal stenosis. Normal-appearing cervical spinal cord.   Severe left foraminal stenosis at  C3-C4 due to uncovertebral hypertrophy. Minimal left foraminal narrowing at C4-C5 and mild bilateral foraminal narrowing at C5-C6. Overall findings are  not significantly changed from the previous study.      - MRI T-spine w/o contrast (10/2011): mild DDD; no significant central or foraminal narrowing  - MRI T-spine w/o contrast (11/2020): new small disc herniation at T6-T7 compared to 2013 study; slightly increased degenerative changes (moderate spondylosis most prominent at T6-T7 through T8-T9)  - MRI T-spine w/o contrast (10/2021): stable from prior; marrow edema around anterior aspects of T6-T7 through T8-T9 compatible with DDD  - MRI T-spine 08/2023: Stable mild degenerative changes without significant canal or neural foraminal stenosis. No abnormal cord signal identified      - MRI L-spine w/o contrast (11/2011): moderate central canal stenosis & moderate bilateral neural foraminal narrowing at L4-L5 related to mild disc bulge & marked ligamentum flavum hypertrophy; otherwise scattered degenerative changes without significant narrowing  - MRI L-spine w/o contrast (06/2014): mild progressive of degenerative changes most prominent at L4-L5    OTHER TESTS:  - EMG/NCS ([facility]) (03/21/2020): performed on arms, dx mild R carpal tunnel syndrome, R radial sensory neuropathy, & 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 Per EFNS motor nerve criteria, the significant conduction velocity slowing (>=30% below LLN) in the median and ulnar CMAPs is supportive of demyelinating neuropathy. Also per EFNS sensory nerve criteria, the abnormal radial and ulnar SNAPs is supportive of demyelinating neuropathy. The presence of partial conduction block suggests acquired demyelination, although there is no clear temporal dispersion so this may be a hereditary demyelinating  polyneuropathy if the apparent partial conduction is technical in nature.   - NCS/US  [facility]) (05/22/23):  This study is abnormal.  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  While she does not UPS-a ultrasound criteria for CIDP there is still sonographic evidence of diffuse and patchy mild-to-moderate nerve enlargement which can be seen in acquired demyelinating neuropathies .  Clinical note: Findings were reviewed with the patient and her husband who expressed understanding.  In the patient's clinical contact her above study could be consistent with a sensory predominant CIDP or other variant.  - NCS/EMG ([facility])(9.2025): This study shows a non-length-dependent demyelinating sensorimotor polyneuropathy with conduction block and temporal dispersion in 3 motor nerves, and meets EFNS electrodiagnostic   criteria for demyelinating neuropathy. However, preserved reflexes are unexpected in CIDP; evaluation for demyelinating neuropathies with corticospinal tract findings (such as CMT1X or myeloneuropathy such as adrenoleukodystrophy) is recommended.       ASSESSMENT AND PLAN:  Audrey Gilmore is a 68 y.o. female     Assessment & Plan  Vestibular dysfunction with dizziness and imbalance  Chronic dizziness and imbalance, described as feeling like walking on a moving floor, with episodes triggered by head movement. Symptoms are constant but vary in intensity. No associated nausea. Recent fall reported. Possible vestibular dysfunction suggested.  - Refer to ENT specialist Dr. Wilkie Bode for evaluation of vestibular dysfunction and associated symptoms.  - Discuss potential vestibular rehabilitation with ENT.    Tinnitus  Screaming tinnitus reported. Possible relation to vestibular dysfunction.  - Discuss tinnitus with ENT during vestibular evaluation.    Chronic dry cough  Chronic dry cough noted. Possible ENT evaluation to  address cough in conjunction with vestibular symptoms.  - Discuss chronic cough with ENT during vestibular evaluation.    Dysphagia  Progressive swallowing difficulty with sensation of a rope around the neck. Possible relation  to disc issues, but further evaluation needed.  - Discuss symptoms with ENT during vestibular evaluation for potential further investigation.    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 Possible CIDP suggested by nerve ultrasound and EMG findings, but clinical presentation is atypical due to lack of weakness and hyperreflexia. IVIG not tolerated due to adverse reactions including elevated blood pressure and itching. Alternative treatments discussed but deferred pending further evaluation.  - Order immune electrophoresis and paraneoplastic panel to investigate underlying causes of neuropathy.  - Discuss potential treatment options for neuropathic pain, including gabapentin, Lyrica, and Cymbalta.    Hand tremor  Bilateral hand tremor present for 1.5 to 2 years, previously responsive to beta blockers, which were not tolerated. No family history of tremor. Tremor impacts daily activities.    Hyponatremia  Consistently low sodium levels noted, with recent measurement at 134 mmol/L. Primary care physician monitoring sodium levels.  - Monitor sodium levels and discuss with primary care physician if necessary.    Query whether all of this can be explained by something like SCA. Will refer to Dr Julieann for genetic testing.       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)    -  Primary    Relevant Orders    IFE Serum, PEP, T Prot & IgA,IgG,IgM    Ambulatory referral to Genetics    Vitamin B12    Methylmalonic Acid    Vitamin B1, Plasma     Copper, Blood    Zinc      Ataxia        Relevant Orders    IFE Serum, PEP, T Prot & IgA,IgG,IgM    NeoComplete Paraneoplastic w/Recombx    Ambulatory referral to Genetics    Vitamin B12    Methylmalonic Acid    Vitamin B1, Plasma    Copper, Blood    Zinc      Vestibulopathy, unspecified laterality        Relevant Orders    Ambulatory referral to ENT    Ambulatory referral to Genetics    Vitamin B12    Methylmalonic Acid    Vitamin B1, Plasma    Copper, Blood    Zinc                  I have spent 50 minutes reviewing the record, interviewing and examining the patient, and documenting the encounter.     Rolan Rubins MD, JD, FAAN         [1]   Past Surgical History:  Procedure Laterality Date    CARDIAC CATHETERIZATION  2012    CAROTID ENDARTERECTOMY  2018    CHOLECYSTECTOMY      COLONOSCOPY, DIAGNOSTIC (SCREENING)      ENDARTERECTOMY, CAROTID ARTERY Right 06/11/2016    Procedure: ENDARTERECTOMY, CAROTID ARTERY WITH PATCH ANGIOPLASTY;  Surgeon: Aurelio Arvis LABOR, MD;  Location: [place] [facility];  Service: Cardiovascular;  Laterality: Right;    EPIDURAL STEROID INJECTION      knees, scapula, frozen shoulder, thumb    HYSTERECTOMY      TONSILLECTOMY      TUBAL LIGATION     [2]   Current Outpatient Medications on File Prior to Visit   Medication Sig Dispense Refill  apixaban  (ELIQUIS ) 5 MG tablet Take 1 tablet (5 mg) by mouth every 12 (twelve) hours 180 tablet 3    Cholecalciferol 50 MCG (2000 UT) Tab Take 2,000 Units by mouth      dilTIAZem (CARDIZEM CD) 120 MG 24 hr capsule Take 1 capsule (120 mg) by mouth      Lactobacillus Rhamnosus, GG, (CULTURELLE PO) Take 1 capsule by mouth daily      LORazepam  (ATIVAN ) 0.5 MG tablet Take 1 tablet (0.5 mg) by mouth daily      traMADol  (ULTRAM ) 50 MG tablet TAKE 2 TABLETS BY MOUTH EVERY 8 HOURS   Takes BID  1    aspirin  EC 81 MG EC tablet Take 1 tablet (81 mg) by mouth every other day (Patient not taking: Reported on 02/17/2024)      Multiple Vitamins-Minerals  (multivitamin with minerals) tablet Take 1 tablet by mouth daily (Patient not taking: Reported on 02/17/2024)      VITAMIN B1-B12 IJ Inject as directed (Patient not taking: Reported on 02/17/2024)       No current facility-administered medications on file prior to visit.   [3]   Allergies  Allergen Reactions    Bactrim [Sulfamethoxazole-Trimethoprim] Hives and Itching    Cephalexin Itching    Cephalosporins Hives and Itching    Ciprofloxacin Other (See Comments)     Severe joint pain    Dye [Iodinated Contrast Media] Itching and Other (See Comments)     Sneezing, itching    Levaquin [Levofloxacin] Other (See Comments)     Severe joint pain    Lipitor [Atorvastatin] Other (See Comments)     Muscle ache/spasms/cramps  Headache    Lisinopril Swelling    Lidocaine  Palpitations

## 2024-03-11 ENCOUNTER — Encounter (INDEPENDENT_AMBULATORY_CARE_PROVIDER_SITE_OTHER): Payer: Self-pay | Admitting: Physician Assistant

## 2024-03-11 ENCOUNTER — Ambulatory Visit (FREE_STANDING_LABORATORY_FACILITY): Admitting: Physician Assistant

## 2024-03-11 VITALS — BP 146/86 | HR 91 | Resp 18 | Ht 67.5 in | Wt 163.0 lb

## 2024-03-11 DIAGNOSIS — I6521 Occlusion and stenosis of right carotid artery: Secondary | ICD-10-CM

## 2024-03-11 DIAGNOSIS — I4891 Unspecified atrial fibrillation: Secondary | ICD-10-CM

## 2024-03-11 DIAGNOSIS — G6181 Chronic inflammatory demyelinating polyneuritis: Secondary | ICD-10-CM

## 2024-03-11 DIAGNOSIS — Z01818 Encounter for other preprocedural examination: Secondary | ICD-10-CM

## 2024-03-11 LAB — CBC
Absolute nRBC: 0 x10 3/uL (ref <=0.00)
Hematocrit: 41.2 % (ref 34.7–43.7)
Hemoglobin: 13.6 g/dL (ref 11.4–14.8)
MCH: 31.3 pg (ref 25.1–33.5)
MCHC: 33 g/dL (ref 31.5–35.8)
MCV: 94.7 fL (ref 78.0–96.0)
MPV: 10.5 fL (ref 8.9–12.5)
Platelet Count: 307 x10 3/uL (ref 142–346)
RBC: 4.35 x10 6/uL (ref 3.90–5.10)
RDW: 12 % (ref 11–15)
WBC: 6.83 x10 3/uL (ref 3.10–9.50)
nRBC %: 0 /100{WBCs} (ref <=0.0)

## 2024-03-11 LAB — BASIC METABOLIC PANEL
Anion Gap: 7 (ref 5.0–15.0)
BUN: 21 mg/dL (ref 7–21)
CO2: 27 meq/L (ref 17–29)
Calcium: 9.6 mg/dL (ref 7.9–10.2)
Chloride: 102 meq/L (ref 99–111)
Creatinine: 0.9 mg/dL (ref 0.4–1.0)
GFR: >60 mL/min/1.73 m2 (ref >=60.0)
Glucose: 95 mg/dL (ref 70–100)
Hemolysis Index: 18 {index}
Potassium: 4.7 meq/L (ref 3.5–5.3)
Sodium: 136 meq/L (ref 135–145)

## 2024-03-11 NOTE — PEC In-Person Visit (H&P) (Incomplete)
 Pre-Anesthesia Evaluation     Pre-op Interview visit requested by:   Reason for pre-op interview visit: Patient anticipating Ablation - Atrial Fibrillation AFFERA procedure.         History of Present Illness/Summary:  Patient presents to the [facility] clinic for a pre-operative evaluation.    Assessment/Plan:    1.  Encounter for preoperative assessment [Z01.818]    No orders of the defined types were placed in this encounter.      2.  Surgical Diagnosis:  --Afib     3.  ***    4.  ***              Patient voiced understanding of all instructions.  All questions and concerns addressed at this time. This assessment will be conveyed to the surgery and anesthesiology teams & the patient will be evaluated the morning of surgery.        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    Coronary artery disease 2018    carotid artery endarterectomy    Dizziness ?    Dysphagia     Gastroesophageal reflux disease     Headache     Hearing loss     Hiatal hernia     Hypercholesterolemia     Hypertension 2018    able blood pressure    Hyponatremia     Low back pain     Memory loss ?    came on gradually over the years    Neuropathy     neuropathy    Numbness     Osteopenia     Pain     Palpitations     Due to  dehydration/increased caffeine intake    Paroxysmal SVT (supraventricular tachycardia)     Last episode ~1992    Peripheral arterial disease     dibt remember    Pins and needles sensation     Post-operative nausea and vomiting     Severe Nausea    Weakness      Past Surgical History[1]     Medication List            Accurate as of March 11, 2024  4:05 PM. Always use your most recent med list.                acetaminophen  650 MG CR tablet  Take 1 tablet (650 mg) by mouth every 8 (eight) hours as needed for Pain  Commonly known as: TYLENOL   Medication Adjustments for Surgery: Take as prescribed  apixaban  5 MG tablet  Take 1 tablet (5 mg) by mouth every 12 (twelve) hours  Commonly known as: ELIQUIS   Medication Adjustments for Surgery: Take as prescribed     Cholecalciferol 50 MCG (2000 UT) Tabs  Take 2,000 Units by mouth  Medication Adjustments for Surgery: Hold day of surgery     Cyanocobalamin 1000 MCG/ML Kit  Inject as directed  Medication Adjustments for Surgery: Hold day of surgery     dilTIAZem 120 MG 24 hr capsule  Take 1 capsule (120 mg) by mouth  Commonly known as: CARDIZEM CD  Medication Adjustments for Surgery: Take as prescribed     LORazepam  0.5 MG tablet  Take 1 tablet (0.5 mg) by mouth daily  Commonly known as: ATIVAN   Medication Adjustments for Surgery: Take as prescribed     traMADol  50 MG tablet  TAKE 2 TABLETS BY MOUTH EVERY 8 HOURS   Takes BID  Commonly known as: ULTRAM   Medication Adjustments for Surgery: Take as prescribed            Allergies[2]  Social History     Occupational History    Not on file   Tobacco Use    Smoking status: Former     Current packs/day: 0.00     Types: Cigarettes     Start date: 05/31/1975     Quit date: 05/31/1983     Years since quitting: 40.8    Smokeless tobacco: Never   Vaping Use    Vaping status: Never Used   Substance and Sexual Activity    Alcohol use: Not Currently     Comment: < weekly basis    Drug use: Never    Sexual activity: Not on file        Menstrual History:   LMP / Status  Hysterectomy     No LMP recorded. Patient has had a hysterectomy.    Tubal Ligation?  No valid surgical or medical questions entered.           Exam Scores:   SDB score  OSA Risk Category: No Risk        STBUR score       PONV score  Nausea Risk: VERY SEVERE RISK    MST score  MST Score: 0    PEN-FAST score       Frailty score  CFS Score: 4    MICA  MICA %: 0.83    RCRI score  RCRI Score: 2    DASI  DASI Score: 18.95  METs Level: 5.07    EA-DIVA       CHADsVasc  CHADSVASC Score: 3         Visit Vitals  BP 146/86   Pulse 91   Resp 18   Ht 1.715 m (5' 7.5)   Wt 73.9 kg (163 lb)   SpO2 97%   BMI 25.15 kg/m   Overweight based on BMI.    Review of Systems   Constitutional: Negative.    HENT:  Positive for congestion.    Eyes: Negative.    Respiratory: Negative.     Cardiovascular:  Positive for palpitations.        Chronic     Gastrointestinal: Negative.         Bloating     Genitourinary: Negative.    Musculoskeletal:  Positive for neck pain.        Chronic     Skin: Negative.    Neurological:  Positive for dizziness and tingling.  Chronic 2/2 to chronic IDP   Endo/Heme/Allergies: Negative.    Psychiatric/Behavioral: Negative.         Physical Exam:  Mallampati: III  TM distance: > 3 FB (> 6 cm)  Mouth opening: > 3 FB (> 6 cm)  Neck extension: full  Upper lip bite assessment: class I  (-) enlarged neck circumference    Normal dentition  Dentral drawing normal.    Normal neurological exam  Mental status: alert and oriented x3  Speech: normal    Normal cardiovascular exam  Heart rhythm: regular        Normal pulmonary exam  Breath sounds clear to auscultation bilaterally                                    Anesthesia Plan:  ASA 3   Anesthetic Options Discussed: General    DNR status: Patient or surrogate requests that DNR status remain full code.  Discussed DNR status with: Patient      A discussion with regarding next steps to prepare for the procedure and the planned  anesthesia care took place during today's visit.  I explained that the patient will meet with their anesthesiology providers on the DOS.  Discussed with Patient      Acceptability of blood products: Accepted  Use of blood products discussed with Patient                                                               [1]   Past Surgical History:  Procedure Laterality Date    CARDIAC CATHETERIZATION  2012    CAROTID ENDARTERECTOMY  2018    CHOLECYSTECTOMY      COLONOSCOPY, DIAGNOSTIC (SCREENING)      ENDARTERECTOMY, CAROTID ARTERY Right 06/11/2016    Procedure: ENDARTERECTOMY, CAROTID ARTERY WITH PATCH ANGIOPLASTY;  Surgeon: Aurelio Arvis LABOR, MD;  Location: [facility];  Service: Cardiovascular;  Laterality: Right;    EPIDURAL STEROID INJECTION      knees, scapula, frozen shoulder, thumb    HYSTERECTOMY      TONSILLECTOMY      TUBAL LIGATION     [2]   Allergies  Allergen Reactions    Bactrim [Sulfamethoxazole-Trimethoprim] Hives and Itching     Had clindamycin at the same time and does not know which one caused the allergic reaction      Cephalexin Itching    Cephalosporins Hives and Itching    Ciprofloxacin Other (See Comments)     Severe joint pain    Clindamycin Hives and Itching    Dye [Iodinated Contrast Media] Itching and Other (See Comments)     Sneezing, itching    Levaquin [Levofloxacin] Other (See Comments)     Severe joint pain    Lipitor [Atorvastatin] Other (See Comments)     Muscle ache/spasms/cramps  Headache    Lisinopril Swelling    Lidocaine  Palpitations

## 2024-03-23 NOTE — Progress Notes (Signed)
 Spoke with pt at 0949 on 03/23/24.   Procedure Type: AFIB - AFFERA   Procedure time and date given as 1000 on 03/25/24.   Arrival time given as 0800.       Labs: 03/11/24    Per MD note:   Ablation Scheduling Addendum:   Procedure:      AFIB PAROXYSMAL and Flutter  Mapping:                     Per EY  Anesthesia Plan:         GENERAL  Pre-Op Imaging:         NONE  Discharge Plan:          Discharge Plan: SAME DAY DISCHARGE  Location/Info:              [facility]     Medication Instructions:  Anticoagulants:           Continue Eliquis /Pradaxa/Xarelto   Antiarrhythmics:  na

## 2024-03-24 ENCOUNTER — Telehealth (INDEPENDENT_AMBULATORY_CARE_PROVIDER_SITE_OTHER): Payer: Self-pay

## 2024-03-24 NOTE — Telephone Encounter (Signed)
 Confirmed with mel- she spoke to patient and answered questions.

## 2024-03-24 NOTE — Telephone Encounter (Signed)
 Pt has ablation tomorrow with EY. Pt supposed to be at the hospital at 8am. Pt wants to know procedure time. Pt needs to know when to stop taking clear fluids.

## 2024-03-25 ENCOUNTER — Encounter
Admission: RE | Disposition: A | Discharge status: Home / Self Care | Payer: Self-pay | Source: Ambulatory Visit | Attending: Internal Medicine

## 2024-03-25 ENCOUNTER — Ambulatory Visit: Admitting: Student in an Organized Health Care Education/Training Program

## 2024-03-25 ENCOUNTER — Encounter: Payer: Self-pay | Admitting: Internal Medicine

## 2024-03-25 ENCOUNTER — Ambulatory Visit
Admission: RE | Admit: 2024-03-25 | Discharge: 2024-03-25 | Disposition: A | Source: Ambulatory Visit | Attending: Internal Medicine | Admitting: Internal Medicine

## 2024-03-25 DIAGNOSIS — Z87891 Personal history of nicotine dependence: Secondary | ICD-10-CM | POA: Insufficient documentation

## 2024-03-25 DIAGNOSIS — I483 Typical atrial flutter: Secondary | ICD-10-CM | POA: Insufficient documentation

## 2024-03-25 DIAGNOSIS — I480 Paroxysmal atrial fibrillation: Secondary | ICD-10-CM | POA: Insufficient documentation

## 2024-03-25 DIAGNOSIS — Z79899 Other long term (current) drug therapy: Secondary | ICD-10-CM | POA: Insufficient documentation

## 2024-03-25 DIAGNOSIS — Z01818 Encounter for other preprocedural examination: Secondary | ICD-10-CM

## 2024-03-25 HISTORY — PX: ABLATION - ATRIAL FIBRILLATION: EP100

## 2024-03-25 LAB — ECG 12-LEAD
Atrial Rate: 359 {beats}/min
Q-T Interval: 350 ms
QRS Duration: 90 ms
QTC Calculation (Bezet): 435 ms
R Axis: 73 degrees
T Axis: 75 degrees
Ventricular Rate: 93 {beats}/min

## 2024-03-25 LAB — ACT KAOLIN POCT
ACT Kaolin POCT: 400 s — ABNORMAL HIGH (ref 74–147)
ACT Kaolin POCT: 320 s — ABNORMAL HIGH (ref 74–147)
ACT Kaolin POCT: 377 s — ABNORMAL HIGH (ref 74–147)

## 2024-03-25 LAB — WHOLE BLOOD GLUCOSE POCT: Whole Blood Glucose POCT: 95 mg/dL (ref 70–100)

## 2024-03-25 SURGERY — ABLATION - ATRIAL FIBRILLATION
Anesthesia: Anesthesia General

## 2024-03-25 MED ORDER — LIDOCAINE HCL 1 % IJ SOLN
1.0000 mL | Freq: Once | INTRAMUSCULAR | Status: DC | PRN
Start: 2024-03-25 — End: 2024-03-25

## 2024-03-25 MED ORDER — HEPARIN (PORCINE) IN NACL 2-0.9 UNIT/ML-% IJ SOLN (WRAP)
INTRAVENOUS | Status: AC
Start: 2024-03-25 — End: 2024-03-25
  Filled 2024-03-25: qty 2000

## 2024-03-25 MED ORDER — LACTATED RINGERS IV SOLN
INTRAVENOUS | Status: DC | PRN
Start: 2024-03-25 — End: 2024-03-25

## 2024-03-25 MED ORDER — HYDROMORPHONE HCL 1 MG/ML IJ SOLN
0.5000 mg | INTRAMUSCULAR | Status: DC | PRN
Start: 2024-03-25 — End: 2024-03-25

## 2024-03-25 MED ORDER — PROPOFOL INFUSION 10 MG/ML
INTRAVENOUS | Status: DC | PRN
Start: 2024-03-25 — End: 2024-03-25
  Administered 2024-03-25: 130 mg via INTRAVENOUS
  Administered 2024-03-25: 50 ug/kg/min via INTRAVENOUS

## 2024-03-25 MED ORDER — PHENYLEPHRINE HCL 10 MG/ML IV SOLN (WRAP)
Status: DC | PRN
Start: 2024-03-25 — End: 2024-03-25
  Administered 2024-03-25 (×2): 100 ug via INTRAVENOUS

## 2024-03-25 MED ORDER — PHENYLEPHRINE 100 MCG/ML IV SYRINGE FOR INFUSION (ANESTHESIA)
PREFILLED_SYRINGE | INTRAVENOUS | Status: DC | PRN
Start: 2024-03-25 — End: 2024-03-25
  Administered 2024-03-25: 30 ug/min via INTRAVENOUS

## 2024-03-25 MED ORDER — HEPARIN SODIUM (PORCINE) 1000 UNIT/ML IJ SOLN (WRAP)
INTRAMUSCULAR | Status: DC | PRN
Start: 2024-03-25 — End: 2024-03-25
  Administered 2024-03-25: 3000 [IU] via INTRAVENOUS
  Administered 2024-03-25: 13000 [IU] via INTRAVENOUS

## 2024-03-25 MED ORDER — GLYCOPYRROLATE 0.2 MG/ML IJ SOLN (WRAP)
INTRAMUSCULAR | Status: DC | PRN
Start: 2024-03-25 — End: 2024-03-25
  Administered 2024-03-25: .2 mg via INTRAVENOUS

## 2024-03-25 MED ORDER — FENTANYL CITRATE (PF) 50 MCG/ML IJ SOLN (WRAP)
INTRAMUSCULAR | Status: DC | PRN
Start: 2024-03-25 — End: 2024-03-25
  Administered 2024-03-25: 50 ug via INTRAVENOUS

## 2024-03-25 MED ORDER — ROCURONIUM BROMIDE 50 MG/5ML IV SOLN
INTRAVENOUS | Status: AC
Start: 2024-03-25 — End: 2024-03-25
  Filled 2024-03-25: qty 10

## 2024-03-25 MED ORDER — SUGAMMADEX SODIUM 200 MG/2ML IV SOLN
INTRAVENOUS | Status: AC
Start: 2024-03-25 — End: 2024-03-25
  Filled 2024-03-25: qty 2

## 2024-03-25 MED ORDER — DEXAMETHASONE SODIUM PHOSPHATE 4 MG/ML IJ SOLN (WRAP)
INTRAMUSCULAR | Status: DC | PRN
Start: 2024-03-25 — End: 2024-03-25
  Administered 2024-03-25: 4 mg via INTRAVENOUS

## 2024-03-25 MED ORDER — LIDOCAINE HCL 1 % IJ SOLN
INTRAMUSCULAR | Status: AC | PRN
Start: 2024-03-25 — End: 2024-03-25
  Administered 2024-03-25: 5 mL via SUBCUTANEOUS

## 2024-03-25 MED ORDER — ROCURONIUM BROMIDE 10 MG/ML IV SOLN (WRAP)
INTRAVENOUS | Status: DC | PRN
Start: 2024-03-25 — End: 2024-03-25
  Administered 2024-03-25: 50 mg via INTRAVENOUS
  Administered 2024-03-25: 20 mg via INTRAVENOUS

## 2024-03-25 MED ORDER — ACETAMINOPHEN 500 MG PO TABS
1000.0000 mg | ORAL_TABLET | Freq: Once | ORAL | Status: AC | PRN
Start: 2024-03-25 — End: 2024-03-25
  Administered 2024-03-25: 1000 mg via ORAL
  Filled 2024-03-25: qty 2

## 2024-03-25 MED ORDER — FENTANYL CITRATE (PF) 50 MCG/ML IJ SOLN (WRAP)
25.0000 ug | INTRAMUSCULAR | Status: DC | PRN
Start: 2024-03-25 — End: 2024-03-25

## 2024-03-25 MED ORDER — TRAMADOL HCL 50 MG PO TABS
50.0000 mg | ORAL_TABLET | Freq: Once | ORAL | Status: AC | PRN
Start: 2024-03-25 — End: 2024-03-25
  Administered 2024-03-25: 50 mg via ORAL
  Filled 2024-03-25: qty 1

## 2024-03-25 MED ORDER — PROTAMINE SULFATE 10 MG/ML IV SOLN
INTRAVENOUS | Status: DC | PRN
Start: 2024-03-25 — End: 2024-03-25
  Administered 2024-03-25: 60 mg via INTRAVENOUS

## 2024-03-25 MED ORDER — HEPARIN SODIUM (PORCINE) 1000 UNIT/ML IJ SOLN
INTRAMUSCULAR | Status: AC
Start: 2024-03-25 — End: 2024-03-25
  Filled 2024-03-25: qty 20

## 2024-03-25 MED ORDER — OXYCODONE HCL 5 MG PO TABS
5.0000 mg | ORAL_TABLET | Freq: Once | ORAL | Status: DC | PRN
Start: 2024-03-25 — End: 2024-03-25

## 2024-03-25 MED ORDER — LIDOCAINE 4 % EX CREAM (WRAP)
TOPICAL_CREAM | Freq: Once | CUTANEOUS | Status: DC | PRN
Start: 2024-03-25 — End: 2024-03-25

## 2024-03-25 MED ORDER — LIDOCAINE HCL 1 % IJ SOLN
INTRAMUSCULAR | Status: AC
Start: 2024-03-25 — End: 2024-03-25
  Filled 2024-03-25: qty 10

## 2024-03-25 MED ORDER — SUGAMMADEX SODIUM 200 MG/2ML IV SOLN
INTRAVENOUS | Status: DC | PRN
Start: 2024-03-25 — End: 2024-03-25
  Administered 2024-03-25: 300 mg via INTRAVENOUS

## 2024-03-25 MED ORDER — PANTOPRAZOLE SODIUM 40 MG PO TBEC: 40.0000 mg | DELAYED_RELEASE_TABLET | Freq: Every day | ORAL | 0 refills | Status: DC

## 2024-03-25 MED ORDER — FENTANYL CITRATE (PF) 50 MCG/ML IJ SOLN (WRAP)
INTRAMUSCULAR | Status: AC
Start: 2024-03-25 — End: 2024-03-25
  Filled 2024-03-25: qty 2

## 2024-03-25 MED ORDER — PROPOFOL 10 MG/ML IV EMUL (WRAP)
INTRAVENOUS | Status: AC
Start: 2024-03-25 — End: 2024-03-25
  Filled 2024-03-25: qty 20

## 2024-03-25 MED ORDER — ONDANSETRON HCL 4 MG/2ML IJ SOLN
INTRAMUSCULAR | Status: DC | PRN
Start: 2024-03-25 — End: 2024-03-25
  Administered 2024-03-25: 4 mg via INTRAVENOUS

## 2024-03-25 MED ORDER — ACETAMINOPHEN 500 MG PO TABS
500.0000 mg | ORAL_TABLET | Freq: Four times a day (QID) | ORAL | Status: DC | PRN
Start: 2024-03-25 — End: 2024-03-25

## 2024-03-25 MED ORDER — LACTATED RINGERS IV SOLN
INTRAVENOUS | Status: DC
Start: 2024-03-25 — End: 2024-03-25
  Filled 2024-03-25: qty 1000

## 2024-03-25 SURGICAL SUPPLY — 10 items
CATHETER ELECTROPHYSIOLOGY L110 CM OD6 FR 2-5-2 MM SPACE LARGE 4 CURVE (Catheter) ×1 IMPLANT
CATHETER EXTENSION CABLE (Ablation) ×1 IMPLANT
CATHETER ULTRASOUND OD8 FR L90 CM INTRACARDIAC 4 WAY STEERING REAL (Catheter) ×1 IMPLANT
GUIDEWIRE VASCULAR OD.035 IN L150 CM 3 MM RADIUS J CURVE FIX CORE PTFE (Guidewire) ×1 IMPLANT
INTRODUCER GUIDEWIRE S-MAK L10 CM OD4 FR ODSEC21 GA STAINLESS STEEL (Sheaths) ×1 IMPLANT
INTRODUCER SHEATH OD9 FR L23 CM RADIOPAQUE HEMOSTASIS VALVE BRITE TIP (Introducer) ×1 IMPLANT
LOCATION REFERENCE PATCH (Ablation) ×1 IMPLANT
SHEATH VSTK 230P RF WIRE 63CM 45DEG D1 (Guidewire) ×1 IMPLANT
SPHERE-9 CATHETER (Catheter) ×1 IMPLANT
TUBING SET FOR THE SPHERE-9 CATHETER (Ablation) ×1 IMPLANT

## 2024-03-25 NOTE — Discharge Instr - AVS First Page (Addendum)
 Reason for your Hospital Admission:  Atrial flutter ablation and PVI/atrial fibrillation ablation         Instructions for after your discharge:  Interventional Cardiovascular Admission and Recovery  EP Discharge Instructions  Ablation    ACTIVITY:  For the first 24 hours after your procedure: No driving following your procedure due to medications you may have received (have an adult family member or friend drive you home after the procedure). Don't make important decisions or sign legal documents. Don't drink alcohol.  Rest today and tomorrow, gradually resuming your usual activities.  Limit stair usage for the next 24 hours. If you must use the stairs, take one stair at a time leading with the unaffected leg.  Support the puncture site/s with your hand.  Do not lift anything over 10 pounds and you should not do any strenuous activities for at least five (5) days.  That includes pushing, pulling, dragging or moving anything.  Ask the doctor when you may resume strenuous activity  Support the bandaged site when coughing or sneezing. Do not strain when having a bowel movement.   Ask the doctor when you may return to work.    If you received anesthesia:   Common side effects after anesthesia:   Nausea and vomiting  Sore throat or hoarseness (usually temporary)   Urinary retention - If you are experiencing difficulty urinating, please call your physician or go to the nearest emergency room.    Managing nausea  Some people have an upset stomach after the procedure/surgery. This is often because of anesthesia, pain, or pain medicine, or the stress of the procedure/surgery. These tips may help you handle nausea:  Don't push yourself to eat. Your body will tell you when to eat and how much.  Start off with clear liquids and soup. They are easier to digest. Then progress to solid foods as tolerated.  If pain medications were prescribed, they may upset your stomach (It may help to take with some food).    WOUND/INCISION  CARE:  You may shower 24 hours after your procedure. REMOVE the bandage/s 24 hours, let the water passively flow over the site/s, wash gently with your hand, pat the area dry with a clean towel. Leave site/s open to air. Do not submerge site in water (tub bath, pool, etc.) until area is completely healed.  Do not apply any creams, powders, lotions or ointments to the site/s.  Do not rub, scrub, pick, scratch or even use a wash cloth over the site/s.  Observe for signs of infection: redness, warmth, swelling, drainage or if you have a temperature greater than 100.4 degrees F. If you suspect infection, call the doctor who performed the procedure.  You may have some tenderness. May take ACETAMINOPHEN  (Tylenol ) if needed.  You may experience some mild bruising.  If you notice bleeding either at the incision site or underneath the skin, you should lay down flat and hold pressure on the area for 10 minutes and call your doctor.  If the bleeding does not stop, you should continue to hold pressure and call 9-1-1.  If your leg/ arm becomes cold, numb, painful, grayish in color, or change from usual color/sensation occurs, you should call 9-1-1.   If you have trouble breathing or facial swelling, call 9-1-1 right away.    WHEN TO CALL THE DOCTOR:  1.  If you suspect infection.  2.  Rapid beating of the heart for more than 30 seconds.  3.  Chest pain,  shortness of breath, dizziness or lightheadedness while having palpitations.  4.  Chest discomfort or intolerable pain.  5.  Difficulty swallowing  6.  Coughing up blood   7.  Rash, itching, or hives may mean you have an allergic reaction. Report this right away.  Note: Ask your doctor what to expect about your heart rate / rhythm.  Sometimes the irregularity goes away immediately after the procedure, other times it may take longer to subside.

## 2024-03-25 NOTE — Progress Notes (Addendum)
 Pt remains stable, assisted oob ambulated in the halls and voided. Right Groin puncture site CDI, no bleeding/hematoma, PP palpable. Discharge instruction reviewed with patient including, medication, activity, diet, return visit to MD. Patient verbalized understanding. A copy of instructions was given to patient. At  1640 pt was discharged home in stable condition.

## 2024-03-25 NOTE — Anesthesia Preprocedure Evaluation (Signed)
 Anesthesia Evaluation    AIRWAY    Mallampati: II    TM distance: >3 FB  Neck ROM: full  Mouth Opening:full   CARDIOVASCULAR    regular and normal       DENTAL    no notable dental hx               PULMONARY         OTHER FINDINGS                                        Relevant Problems   CARDIO   (+) AF (atrial fibrillation) (CMS/HCC)   (+) Carotid stenosis, asymptomatic, right     2.  Surgical Diagnosis:  --Afib   --Hospitalized in Jan 2025@ [facility]SH for palpitations  --follows with [facility] and Dr Oliva Wish (EP)  --Medication Instructions:  Anticoagulants:           Continue Eliquis /Pradaxa/Xarelto   Antiarrhythmics:  na     3.  Chronic Inflammatory Demyelinating Polyneuropathy  --diagnosed about a year ago after symptoms following a flue shot - tingling in her legs which progressed over 6 months to hands, face and torso, with continued worsening of symptoms.   --tried IVIG - however, had severe itching which took about 2 months to resolve.   --follows with [facility]  --EMG 01/02/24: The right sural sensory response is appropriate for age.  The right median sensory response is mildly reduced with prolonged peak latency.  The right ulnar sensory response is normal with mildly prolonged peak latency.  The right radial sensory response is reduced with mildly prolonged peak latency.     Right median motor response shows reduced amplitude (2.7 mV) with conduction block and temporal dispersion in the forearm segment dispersion in the forearm segment; distal latency is prolonged (8.6 ms) and forearm segment conduction velocity is slow (18 m/s).     The right ulnar motor response is mildly reduced (5.5 mV) with forearm segment conduction block and temporal dispersion; distal latency is prolonged (6.8 ms) and forearm segment conduction velocity is slow (22 m/s).     The right peroneal motor response shows normal distal amplitude (3.0 mV); distal latency is mildly prolonged (6.9 ms) with conduction block  between the ankle and fibular head as well as slow slow conduction velocity (29 m/s).  The right tibial motor response is reduced (2.6 mV) with prolonged distal latency (8.1 ms).     F waves are significantly prolonged in the right peroneal, tibial, median, and ulnar nerves.     EMG of selected muscles in the right upper and right lower limbs are notable only for mild reinnervation changes in the right vastus medialis and rectus femoris.      non-length-dependent demyelinating sensorimotor polyneuropathy with conduction block and temporal dispersion in 3 motor nerves, and meets EFNS electrodiagnostic criteria for demyelinating neuropathy.         4.  Neck and Back Pain - Chronic  --MRI C Spine 08/27/23: Degenerative change most prominent once again seen at C3/C4 with severe left neural foraminal narrowing which could affect the left C4 nerve root.  --MRI Thoracic Spine 08/27/23: Stable mild DJD changes without significant canal or neural foraminal stenosis, no abnormal cord Signal. Had interpretation of imaging done @ [facility]: Multilevel mild disc desiccation with mild disc bulge at T6-T7 level ventrally indents the thecal sac  without any significant spinal canal or neural foraminal stenosis. No other significant spinal canal or neural foraminal stenosis.      5. Multiple falls  --2/2 to #s 3 and 4.      6. Vestibular Dysfunction with dizziness and imbalance/Tinnitus  --neurology referred to ENT for vestibular rehab     7. Carotid Atherosclerosis  8. Hx of Right Endarterectomy June 2018  --Carotid Duplex 09/25/23:   The right internal carotid artery endarterectomy site was patent with mild   (0-39%) stenosis noted.   There was mild stenosis (0-39%) demonstrated in the left internal carotid   artery.   Antegrade flow noted in bilateral vertebral arteries.   There was no evidence of stenosis of the bilateral external carotid   arteries.   Patent bilateral subclavian arteries with triphasic flow.                Anesthesia  Plan    ASA 3     general                     intravenous induction   Detailed anesthesia plan: general endotracheal            informed consent obtained    Plan discussed with CRNA.    ECG reviewed  pertinent labs reviewed               Signed by: Ripley Leek, MD 03/25/24 8:24 AM

## 2024-03-25 NOTE — Progress Notes (Signed)
 Right Groin figure of 8 sutures removed per protocol. Hemostasis achieved. No bleeding or hematoma present at the site(s). Distal pulses palpable, warm, and dry. No numbness or tingling present. Less than 3 seconds capillary refill. Site C/D/I and dressed with gauze and tegaderm dressing. Patient tolerated well.

## 2024-03-25 NOTE — Progress Notes (Signed)
 EP ABLATION PROCEDURE HANDOFF REPORT    Date Time: 03/25/24 11:54 AM    Proceduralist(s): Diona    INDICATION   atrial fibrillation and atrial flutter    PROCEDURE:    Atrial flutter ablation and PVI/atrial fibrillation ablation    ALLERGIES:    Bactrim [sulfamethoxazole-trimethoprim], Cephalexin, Cephalosporins, Ciprofloxacin, Clindamycin, Dye [iodinated contrast media], Levaquin [levofloxacin], Lipitor [atorvastatin], Lisinopril, and Lidocaine      MEDICAL HISTORY:    Medical History[1]     ACCESS:    8.13F 13F sheath in right femoral vein    Hemostasis: Manual pressure, Figure of 8   Hemostasis time: 11:55AM  Post procedure pulses: palpable  Visual appearance: clean/dry/intact     MEDICATIONS:    Protamine  50 mg    See Anesthesia report for anesthesia meds    VITALS:    BP: 146/65       HR: 67      O2 SAT: 99%       Rhythm:   Sinus rhythm    PROCEDURE DETAILS:    See physician procedure note    Last ACT:   ACT [place] POCT   Date/Time Value Ref Range Status   03/25/2024 11:44 AM 377 (H) 74 - 147 sec Final        Outcomes: Atrial flutter ablation and PVI/Atrial fibrillation ablation                               Complications: None               Report given to: PACU RN         [1]   Past Medical History:  Diagnosis Date    Arthritis     Atrial fibrillation (CMS/HCC) 2024    Atrial flutter (CMS/HCC) 2018    after carotid endarterectomy    Balance problem     Bursitis     Carotid stenosis 2018    right    Carotid stenosis, asymptomatic, right 05/16/2016    Chronic pain     Follows w/Pain Management for many years    Claustrophobia ?    Coronary artery disease 2018    carotid artery endarterectomy    Dizziness ?    Dysphagia     Gastroesophageal reflux disease     Headache     Hearing loss     Hiatal hernia     Hypercholesterolemia     Hypertension 2018    able blood pressure    Hyponatremia     Low back pain     Memory loss ?    came on gradually over the years    Neuropathy     neuropathy    Numbness     Osteopenia      Pain     Palpitations     Due to dehydration/increased caffeine intake    Paroxysmal SVT (supraventricular tachycardia)     Last episode ~1992    Peripheral arterial disease     dibt remember    Pins and needles sensation     Post-operative nausea and vomiting     Severe Nausea    Weakness

## 2024-03-25 NOTE — Progress Notes (Signed)
 IMG Arrhythmia Pre-Procedure Note    I have seen and examined the patient this morning. No change to the recent plan. The risks, benefits, and alternatives to the planned procedure were discussed and the patient consented to continue. Briefly, [name] is 68 year-old woman who presents for atrial fibrillation ablation.    ____________________  Audrey Cassis, MD PhD  March 25, 2024     63 year old woman whom we met in June.  History of endarterectomy 2018, subsequent palpitations.     Multiple previous monitors, butMonitor January 2025 showed a fibrillation atrial flutter burden of 5%. Longest event 39 minutes. Some of it looks regularized, consistent with an atrial flutter. Spontaneous onset of atrial fibrillation is seen.      Echocardiogram January 2025 showed ejection fraction 55 to 60%, normal thickness. Nuclear stress test February 2022 showed a hypertensive response, no ischemia.      In June, rhythm was sinus.  We discussed options.  She declined anticoagulation, she was most concerned about the pain issues.  In May, injury to neck and back.  She did not wish to start cardiac medication.     Since then, she believes that she is probably having events more frequently.  She estimates that she is probably in atrial fibrillation 50% of the time, not sure about currently.  She has gained weight.     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  She was going to get a back injection, and it was canceled due to contrast allergy.     She is uncomfortable from the atrial fibrillation, uncomfortable for other issues as well.      PMH: [Medical History]     [Medical History]  Past Medical History       Diagnosis Date    Arthritis      Atrial fibrillation (CMS/HCC) 2024    Atrial flutter (CMS/HCC) 2018     after carotid endarterectomy    Awareness under anesthesia 2018     during carotid endarterectomy I could feel pain    Balance problem      Bursitis      Carotid stenosis 2018      right    Carotid stenosis, asymptomatic, right 05/16/2016    Chronic pain       Follows w/Pain Management for many years    Claustrophobia ?    Complication of anesthesia       severe nausea    Coronary artery disease 2018     carotid artery endarterectomy    Dizziness ?    Dysphagia      Gastroesophageal reflux disease      Headache      Hearing loss      Hiatal hernia      Hypercholesterolemia      Hypertension 2018     able blood pressure    Low back pain      Memory loss ?     came on gradually over the years    Neuropathy       neuropathy    Numbness      Osteopenia      Pain      Palpitations       Due to dehydration/increased caffeine intake    Paroxysmal supraventricular tachycardia ?    Paroxysmal SVT (supraventricular tachycardia)       Last episode ~1992    Paroxysmal ventricular tachycardia (CMS/HCC) ?    Peripheral arterial disease  dibt remember    Pins and needles sensation      Post-operative nausea and vomiting      Weakness          MEDICATIONS: [Current Medications]     [Current Medications]         Current Outpatient Medications   Medication Sig Dispense Refill    aspirin  EC 81 MG EC tablet Take 1 tablet (81 mg) by mouth every other day        Cholecalciferol 50 MCG (2000 UT) Tab Take 2,000 Units by mouth        dilTIAZem (CARDIZEM CD) 120 MG 24 hr capsule Take 1 capsule (120 mg) by mouth        Lactobacillus Rhamnosus, GG, (CULTURELLE PO) Take 1 capsule by mouth daily        LORazepam  (ATIVAN ) 0.5 MG tablet Take 1 tablet (0.5 mg) by mouth daily        Multiple Vitamins-Minerals (multivitamin with minerals) tablet Take 1 tablet by mouth daily        traMADol  (ULTRAM ) 50 MG tablet TAKE 2 TABLETS BY MOUTH EVERY 8 HOURS   Takes BID   1    VITAMIN B1-B12 IJ Inject as directed          No current facility-administered medications for this visit.        Meds reviewed, no changes since last visit.    SH: [Social History]    [Social History]        Tobacco Use    Smoking status: Former       Current  packs/day: 0.00       Types: Cigarettes       Start date: 05/31/1975       Quit date: 05/31/1983       Years since quitting: 40.6    Smokeless tobacco: Never   Substance Use Topics    Alcohol use: Not Currently       Comment: < weekly basis    Drug use: Never        FH: no history of sudden death.  Family History reviewed and is otherwise not pertinent     REVIEW OF SYSTEMS: All other systems reviewed and negative except as above.     PHYSICAL EXAMINATION  General Appearance: A well-appearing female in no acute distress.   Vital Signs: BP 145/82 (BP Site: Left arm, Patient Position: Sitting, Cuff Size: Medium)   Pulse 88   Ht 1.715 m (5' 7.5)   Wt 70.8 kg (156 lb)   BMI 24.07 kg/m    Neck: Supple without jugular venous distention. Thyroid nonpalpable. Normal carotid upstrokes without bruits.  Chest: Clear to auscultation bilaterally with good air movement and respiratory effort and no wheezes, rales, or rhonchi  Cardiovascular: Normal S1 and physiologically split S2 without murmurs, gallops or rub. PMI of normal size and nondisplaced.         ECG: Atrial flutter with a controlled ventricular rate, 88 bpm.  .

## 2024-03-25 NOTE — Progress Notes (Addendum)
 Pre-Procedure Teaching and Learning Objectives   Learner: Audrey Gilmore, Spouse Tatiana)  Preference for learning: Verbal  Teaching Method: Verbal Instruction   Outcome of Learning: Fully Achieved    Described/Demonstrated the following:     + Responsibilities of patient's care  + Ablation/PM or ICD implant/Loop recorder implant or explant  + Purpose of procedure  + Need to be NPO pre-procedure  + Need for maintaining bedrest & straight leg post-procedure & sheath removal.  + Necessary fluid intake after procedure  + Symptoms of bleeding & states plan to notify nurse  + Dr Diona allowed patient's own Eliquis  to be taken at 0955.    Received pt into [facility] for Afib Ablation with Dr. Diona. Pt call bell within reach. RN to continue to monitor.

## 2024-03-25 NOTE — Progress Notes (Addendum)
 Pt admitted to [facility] PACU 53 at 1219 s/p Ablation. ID verified with staff, connected to continuous cardiac/BP/SPO2 monitoring. right puncture/incision site(s) are soft, non tender, dressing CDI and distal pulses palpable. Family called with update, VSS, care continues.      Addendum 1350: Pt transferred to RM 23 for phase 2 recovery. Bedside report given to Msghana, RN. Spouse brought back to room.

## 2024-03-25 NOTE — Anesthesia Postprocedure Evaluation (Signed)
 Anesthesia Post Evaluation    Patient: Audrey Gilmore    AFFERA  only Ablation - Atrial Fibrillation    Anesthesia type: general    Last Vitals:   Vitals Value Taken Time   BP 110/56 03/25/24 12:30   Temp 37.3 C (99.2 F) 03/25/24 12:19   Pulse 75 03/25/24 12:40   Resp 12 03/25/24 12:40   SpO2 100 % 03/25/24 12:40                 Anesthesia Post Evaluation:     Patient Evaluated: PACU  Patient Participation: complete - patient participated  Level of Consciousness: awake and alert  Pain Score: 0  Pain Management: adequate  Multimodal analgesia pain management approach  Strategies: acetaminophen  and local anesthesia  Airway Patency: patent  Two or more mitigation strategies used for obstructive sleep apnea.  Strategies: awake extubation and multimodal analgesia    Anesthetic complications: No      PONV Status: none    Cardiovascular status: acceptable  Respiratory status: acceptable  Hydration status: acceptable        Signed by: Ripley Leek, MD, 03/25/2024 12:50 PM

## 2024-03-30 ENCOUNTER — Encounter: Payer: Self-pay | Admitting: Internal Medicine

## 2024-03-30 ENCOUNTER — Telehealth (INDEPENDENT_AMBULATORY_CARE_PROVIDER_SITE_OTHER): Payer: Self-pay

## 2024-03-30 NOTE — Telephone Encounter (Signed)
 Date: 03/30/2024    Spoke with patient to schedule an appointment with [facility]     Patient is referred by her neuropathy, Dr. 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     Patient has had previous genetic testing: no    Patient had pharmacogenetic testing done at the [facility] clinic.    Active Ambulatory Problems     Diagnosis Date Noted    Carotid stenosis, asymptomatic, right 05/16/2016    AF (atrial fibrillation) (CMS/HCC) 10/22/2023    CIDP (chronic inflammatory demyelinating polyneuropathy) (CMS/HCC) 03/11/2024     Resolved Ambulatory Problems     Diagnosis Date Noted    No Resolved Ambulatory Problems     Past Medical History:   Diagnosis Date    Arthritis     Atrial fibrillation (CMS/HCC) 2024    Atrial flutter (CMS/HCC) 2018    Balance problem     Bursitis     Carotid stenosis 2018    Chronic pain     Claustrophobia ?    Coronary artery disease 2018    Dizziness ?    Dysphagia     Gastroesophageal reflux disease     Headache     Hearing loss     Hiatal hernia     Hypercholesterolemia     Hypertension 2018    Hyponatremia     Low back pain     Memory loss ?    Neuropathy     Numbness     Osteopenia     Pain     Palpitations     Paroxysmal SVT (supraventricular tachycardia)     Peripheral arterial disease     Pins and needles sensation     Post-operative nausea and vomiting     Weakness        The family history is positive, brother had similar symptoms. Brother passed away in car accident.     Verified insurance and ph number. For telemedicine appointments you must be located in the state of [place] for the appointment.  If you live out of state, this could be accomplished by driving over the border into the state. Appointment via MyChart.     [facility]  P: [phone]  F: [phone]

## 2024-04-06 ENCOUNTER — Telehealth (INDEPENDENT_AMBULATORY_CARE_PROVIDER_SITE_OTHER): Payer: Self-pay

## 2024-04-06 NOTE — Telephone Encounter (Signed)
 Called and LVM with MW message

## 2024-04-06 NOTE — Telephone Encounter (Signed)
 Pt calling. Had ablation 11/12. Pt on Protonix . Pt thinks she is having a reaction. Pt had nightmares the first time she took it. Pt then started taking it in the morning. Pt now getting sudden congestion and has been congested every since. Pt also feels bloated. Pt has been feeling fearful and depressed.

## 2024-04-19 ENCOUNTER — Encounter: Payer: Self-pay | Admitting: Neurology

## 2024-04-19 ENCOUNTER — Encounter (INDEPENDENT_AMBULATORY_CARE_PROVIDER_SITE_OTHER): Payer: Self-pay | Admitting: Internal Medicine

## 2024-04-20 ENCOUNTER — Telehealth (INDEPENDENT_AMBULATORY_CARE_PROVIDER_SITE_OTHER): Payer: Self-pay

## 2024-04-20 ENCOUNTER — Encounter: Payer: Self-pay | Admitting: Neurology

## 2024-04-20 ENCOUNTER — Ambulatory Visit: Attending: Neurology | Admitting: Neurology

## 2024-04-20 DIAGNOSIS — G6181 Chronic inflammatory demyelinating polyneuritis: Secondary | ICD-10-CM | POA: Insufficient documentation

## 2024-04-20 NOTE — Progress Notes (Signed)
 [facility] HISTORY AND PHYSICAL EXAMINATION      Patient Name: Audrey Gilmore  MRN: [id number]  Primary Care MD: [place] Laquetta LABOR, MD Phone: [phone]  Date: 04/20/2024 Time: 2:41 PM    Interval history 12.8.2025  History of Present Illness  Audrey Gilmore is a 68 year old female with atrial fibrillation and atrial flutter who presents with post-ablation symptoms and ongoing spinal pain.    She underwent a cardiac ablation three weeks ago for atrial fibrillation and atrial flutter. Prior to the ablation, she experienced constant atrial fibrillation for the past year, characterized by an awareness of her heartbeat and feeling 'awful' during episodes of atrial flutter, sometimes feeling like she might pass out. She attempted to manage atrial fibrillation with Valsalva maneuvers and ice water, but the episodes became more frequent. Post-ablation, she initially felt better but has felt worse over the last two weeks, attributing some of her symptoms to heart medications, including diltiazem and pantoprazole , which cause bloating, leg swelling, and fatigue.    She has a history of chronic inflammatory demyelinating polyneuropathy (CIDP) and spinal issues, including bulging discs and severe narrowing, which have prevented her from sleeping lying down for several months. She experiences band-like pressure around her torso and neck pain, which worsened after being flat on her back during the ablation procedure. She was previously referred for surgery at [facility] but opted for pain management instead. Her spinal pain has worsened in recent years, causing significant discomfort.    She reports a history of hypoglycemia, experiencing tremors and shaking when her blood sugar drops below 100 mg/dL or rises rapidly. She has observed rapid fluctuations in her blood sugar levels, which she monitors with finger sticks. She recalls an episode where her blood sugar spiked to 247 mg/dL while in the ER for  uncontrolled blood pressure. She suspects her symptoms may be related to her medications, including metoprolol.    She experiences tingling and pins-and-needles sensations, particularly in her armpits and ribs, and reports a needly pain that she feels comes across into her breast. She has tremors in her hands, which she associates with her blood sugar levels. She is hesitant to use medications like gabapentin due to past adverse reactions to medications.    She mentions a possible diagnosis of Sjogren's syndrome, with symptoms of dry eyes, hair, and skin.         Interval history 10.6.2025  Audrey Gilmore is a 69 year old female with CIDP who presents with dizziness and balance issues.    She experiences intense dizziness, described as feeling like walking on a moving floor. This sensation is constant, with varying intensity, and is present even when sitting still. Movement, such as turning her head or picking up objects, exacerbates the sensation. The dizziness began before starting IVIG treatment and has worsened over time. She has experienced falls, including one from standing height while chasing a squirrel, which she attributes to the sensation of the ground moving beneath her.    She describes a twitch in her right eye, which she feels is like a twitch in her eyeball, and has noticed it for the last couple of days. Additionally, she experienced a wavy sensation in her peripheral vision with some colors, lasting a few minutes, which occurred twice without associated headaches. No nausea is reported, but she sometimes notices her eyes moving, causing the world to appear as if it is jumping around.    She reports a history of tremors in  her hands for over a year, which were alleviated by beta blockers, but she could not tolerate the medication. She denies any family history of tremors but mentions her brother had an episode of Guillain-Barre after a Repatha shot and experiences similar tingling  sensations.    She describes a progression of symptoms from tingling to pins and needles, and now sharp pains, which she experiences in her hands, ribs, left side, breast, hip, feet, and legs. She sometimes feels like she cannot feel her feet and legs when standing after sitting for a while. She also reports a sensation of a rope around her neck and difficulty swallowing, which she feels is worsening.    She has a history of hypoglycemia, which has been more erratic recently, causing her to feel super shaky. She mentions a chronic dry cough and low sodium levels, which have been consistent for at least six months. She is concerned about the low sodium but has not been informed of any specific investigations into this issue.      HPI: Audrey Gilmore is a 68 y.o. 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 She was referred by her primary care doctor for further evaluation of her symptoms.    She was diagnosed with CIDP about a year ago after experiencing symptoms following a flu shot and bivalent COVID vaccine in November 2022. She had received four or five previous COVID vaccines without issue. Within 24 hours of the flu shot, she experienced tingling in her legs, which progressed over six months to include her hands, face, and torso. Her symptoms have continued to worsen, with a sensation of walking on pebbles and a band of pressure around her forefoot.    She underwent a workup at [facility], including nerve conduction studies and EMG, which were abnormal. A spinal tap was performed with difficulty, but protein levels were obtained and were normal. She was concerned about an MRI with contrast due to a past fluoroquinolone injury that affected her tendons and cartilage. A neuro ultrasound at [facility] showed nerve involvement.    She began IVIG therapy in March 2025, with four scheduled infusions. The first infusion was well-tolerated,  but the second caused discomfort, and the third resulted in an infiltration, high blood pressure (200/100), and dizziness, leading to an ER visit and cancellation of the fourth infusion. Post-infusion, she experienced intense itching following oral intake, which persisted for two and a half months before resolving.    Her current symptoms include severe sensory disturbances, a constant feeling of being on a rocking boat, and significant balance issues. She has experienced multiple falls, resulting in injuries such as avulsion fractures in her ankle and a tibia fracture. She describes patchy numbness and tingling in her hands and feet, with her feet feeling hot at night. She wears shoes at all times to prevent injury.    She has a history of tinnitus, which worsened after the onset of her current symptoms. She also reports memory issues and short-term memory loss. Her weight has been stable, with a recent slight increase possibly due to water retention. She has a history of hypoglycemia but no diabetes, despite having large babies and negative gestational diabetes tests.      Past Medical History:   Diagnosis Date    Arthritis     Atrial fibrillation (CMS/HCC) 2024    Atrial flutter (CMS/HCC) 2018    after  carotid endarterectomy    Balance problem     Bursitis     Carotid stenosis 2018    right    Carotid stenosis, asymptomatic, right 05/16/2016    Chronic pain     Follows w/Pain Management for many years    Claustrophobia ?    Coronary artery disease 2018    carotid artery endarterectomy    Dizziness ?    Dysphagia     Gastroesophageal reflux disease     Headache     Hearing loss     Hiatal hernia     Hypercholesterolemia     Hypertension 2018    able blood pressure    Hyponatremia     Low back pain     Memory loss ?    came on gradually over the years    Neuropathy     neuropathy    Numbness     Osteopenia     Pain     Palpitations     Due to dehydration/increased caffeine intake    Paroxysmal SVT (supraventricular  tachycardia)     Last episode ~1992    Peripheral arterial disease     dibt remember    Pins and needles sensation     Post-operative nausea and vomiting     Severe Nausea    Weakness        Past Surgical History[1]    Medications:  Medications Ordered Prior to Encounter[2]      Allergies: Allergies[3]      PHYSICAL EXAM:  There were no vitals taken for this visit.      NEUROLOGICAL EXAM:  General: Alert and oriented to person, date, location. Fund of knowledge is appropriate to age/circumstance.  Mood and affect are appropriate. Attention is intact.  Language function including fluency, naming, repetition, comprehension intact.        LAB & TEST RESULTS:  10.2025  Paraneoplastic panel: negative  SPEP and IFE: negative  Cu: normal  B1: normal      LUMBAR PUNCTURE (09/24/22)  OP could not be obtained due to slow CSF flow. 0 WBC, 0 RBC, Protein 33, Glucose 60.    Labs (many at [facility] ):  - 2021: +ANA x2, Lyme neg, ANCA neg, anti-SS neg  - 2022: vitamin B12 336, MMA 197 (wnl), SPEP wnl, folate 23, TSH 3.53, vitamin D 65,  LDL 113, cholesterol 187, triglycerides 99  - 05/2021: vitamin D 71, ESR 4, CRP <0.5, CBC wnl, CMP wnl  - 10/2021: Mg 2, thiamine 130 (wnl), pyridoxine 50 (upper limit of nl), +ANA w/homogenous pattern (1:80 titer), ACE wnl, Cu 130 (wnl), serum Ig ratios wnl, HBsAg/HBcAb neg, HCV Ab neg  - 10/2022: A1c 5.6, SPEP/Ig quant normal, Light-chain with minimal kappa elevation but ratio WNL, ANA negative, ENA negative, CMT [facility] panel negative, [facility] demyelinating panel negative  - 06/2023: SPEP nl; Kappa light chain elevated but ratio normal    IMAGES:   Imaging (summarized radiology interpretations, all from [facility] ):  - MRI brain w/o contrast (06/20/2011): done for headache and unsteady gait; minimal white matter changes  - MRI brain w/o contrast (09/08/2016): done for possible stroke during CEA; stable  - MRI brain w/o contrast (12/07/2020): moderate pontine white matter lesions & mild  supratentorial white matter intensities with chronic small vessel changes  - MRI brain w/o contrast (9.2025): Stable bilateral white matter signal abnormalities and pontine signal abnormality, which is favored to represent mild-to-moderate chronic ischemic small vessel white matter disease in  this patient age group.      - MRA neck w/contrast (12/2011): mild proximal R ICA stenosis (20-30%)  - MRA neck w/contrast (04/2016): severe proximal R ICA stenosis (90%), no proximal L ICA stenosis  - Carotid US  (05/2021): mild L ICA origin stenosis (1-49%); R ICA open s/p CEA     - MRI C-spine w/o contrast (12/04/2020): no central stenosis; moderate narrowing of L neural foramen at C3-C4; mild narrowing at R neural foramen at C5-C6  - MRI C-spine w/o contrast (10/13/2021): mild to moderate DDD with nl cord & no major changes since prior study, noting severe left foraminal stenosis at C3-C4 and mild bilateral foraminal narrowing at C4-C5 through C6-C7  - MRI Cspine 09/2023: Mild degenerative disc disease of the cervical spine. No significant central spinal canal stenosis. Normal-appearing cervical spinal cord.   Severe left foraminal stenosis at C3-C4 due to uncovertebral hypertrophy. Minimal left foraminal narrowing at C4-C5 and mild bilateral foraminal narrowing at C5-C6. Overall findings are not significantly changed from the previous study.      - MRI T-spine w/o contrast (10/2011): mild DDD; no significant central or foraminal narrowing  - MRI T-spine w/o contrast (11/2020): new small disc herniation at T6-T7 compared to 2013 study; slightly increased degenerative changes (moderate spondylosis most prominent at T6-T7 through T8-T9)  - MRI T-spine w/o contrast (10/2021): stable from prior; marrow edema around anterior aspects of T6-T7 through T8-T9 compatible with DDD  - MRI T-spine 08/2023: Stable mild degenerative changes without significant canal or neural foraminal stenosis. 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 Per EFNS motor nerve criteria, the significant conduction velocity slowing (>=30% below LLN) in the median and ulnar CMAPs is supportive of demyelinating neuropathy. Also per EFNS sensory nerve criteria, the abnormal radial and ulnar SNAPs is supportive of demyelinating neuropathy. The presence of partial conduction block suggests acquired demyelination, although there is no clear temporal dispersion so this may be a hereditary demyelinating polyneuropathy if the apparent partial conduction is technical in nature.   - NCS/US  [facility]) (05/22/23):  This study is abnormal.  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  While she does not UPS-a ultrasound criteria for CIDP there is still sonographic evidence of diffuse and patchy mild-to-moderate nerve enlargement which can be seen in acquired demyelinating neuropathies .  Clinical note: Findings were reviewed with the patient and her husband who expressed understanding.  In the patient's clinical contact her above study could be consistent with a sensory predominant CIDP or other variant.  - NCS/EMG ([facility])(9.2025): This study shows a non-length-dependent demyelinating sensorimotor polyneuropathy  with conduction block and temporal dispersion in 3 motor nerves, and meets EFNS electrodiagnostic   criteria for demyelinating neuropathy. However, preserved reflexes are unexpected in CIDP; evaluation for demyelinating neuropathies with corticospinal tract findings (such as CMT1X or myeloneuropathy such  as adrenoleukodystrophy) is recommended.       ASSESSMENT AND PLAN:  Loghan Subia Romack is a 68 y.o. female     Assessment & Plan  Chronic inflammatory demyelinating polyneuropathy (CIDP) and neuropathic pain  CIDP with neuropathic pain, tingling, and needly pain in arms and armpits. Hyperreflexia is unusual, so consider alternative diagnosis.   - Genetics referral       Atrial fibrillation and atrial flutter, post-ablation  Recent cardiac ablation for atrial fibrillation and atrial flutter. Post-procedure recovery complicated by medication side effects and spinal pain. Symptoms include awareness of heartbeat and episodes of atrial flutter.  - Follow up with cardiologist for post-ablation evaluation.    Abnormal glycemic control  Episodes of hypoglycemia and hyperglycemia. Symptoms include tremors and shakiness. Potential measurement errors with current finger stick method.  - Consider continuous glucose monitoring to assess glycemic trends.  - Discuss findings with primary care physician and consider endocrinology referral.      Problem List Items Addressed This Visit       CIDP (chronic inflammatory demyelinating polyneuropathy) (CMS/HCC) - Primary         I have spent  30 minutes reviewing the record, interviewing and examining the patient, and documenting the encounter.     Rolan Rubins MD, JD, FAAN         [1]   Past Surgical History:  Procedure Laterality Date    ABLATION - EPICARDIAL N/A 03/25/2024    Procedure: AFFERA  only Ablation - Atrial Fibrillation;  Surgeon: Diona Genre, MD PhD;  Location: [facility];  Service: Cardiovascular;  Laterality: N/A;    CARDIAC CATHETERIZATION  2012    CAROTID ENDARTERECTOMY   2018    CHOLECYSTECTOMY      COLONOSCOPY, DIAGNOSTIC (SCREENING)      ENDARTERECTOMY, CAROTID ARTERY Right 06/11/2016    Procedure: ENDARTERECTOMY, CAROTID ARTERY WITH PATCH ANGIOPLASTY;  Surgeon: Aurelio Arvis LABOR, MD;  Location: [facility];  Service: Cardiovascular;  Laterality: Right;    EPIDURAL STEROID INJECTION      knees, scapula, frozen shoulder, thumb    HYSTERECTOMY      TONSILLECTOMY      TUBAL LIGATION     [2]   Current Outpatient Medications on File Prior to Visit   Medication Sig Dispense Refill    acetaminophen  (TYLENOL ) 650 MG CR tablet Take 1 tablet (650 mg) by mouth every 8 (eight) hours as needed for Pain      apixaban  (ELIQUIS ) 5 MG tablet Take 1 tablet (5 mg) by mouth every 12 (twelve) hours 180 tablet 3    Cyanocobalamin 1000 MCG/ML Kit Inject as directed      dilTIAZem (CARDIZEM CD) 120 MG 24 hr capsule Take 1 capsule (120 mg) by mouth      LORazepam  (ATIVAN ) 0.5 MG tablet Take 1 tablet (0.5 mg) by mouth daily (Patient taking differently: Take 1 tablet (0.5 mg) by mouth 4 (four) times daily)      pantoprazole  (PROTONIX ) 40 MG tablet Take 1 tablet (40 mg) by mouth once daily 30 tablet 0    traMADol  (ULTRAM ) 50 MG tablet TAKE 2 TABLETS BY MOUTH EVERY 8 HOURS   Takes BID (Patient taking differently: Take 2 tablets (100 mg) by mouth Takes bid to TID)  1    Cholecalciferol 50 MCG (2000 UT) Tab Take 2,000 Units by mouth (Patient not taking: Reported on 04/20/2024)       No current facility-administered medications on file prior to visit.   [3]  Allergies  Allergen Reactions    Bactrim [Sulfamethoxazole-Trimethoprim] Hives and Itching     Had clindamycin at the same time and does not know which one caused the allergic reaction      Cephalexin Itching    Cephalosporins Hives and Itching    Ciprofloxacin Other (See Comments)     Severe joint pain    Clindamycin Hives and Itching    Dye [Iodinated Contrast Media] Itching and Other (See Comments)     Sneezing, itching    Levaquin [Levofloxacin]  Other (See Comments)     Severe joint pain    Lipitor [Atorvastatin] Other (See Comments)     Muscle ache/spasms/cramps  Headache    Lisinopril Swelling    Lidocaine  Palpitations

## 2024-04-20 NOTE — Telephone Encounter (Signed)
 Called pt. Pt BP yesterday 60 systolics and she was very dizzy and lightheaded. Instructed pt to wait until tonight to take diltiazem PM (last took early on Sunday AM). Pt verbalized understanding.

## 2024-04-20 NOTE — Telephone Encounter (Signed)
 Pt calling for advice. She took her diltiazem in the morning when she usually takes it at night. Mixed it up with protonix . Pt felt dizzy this morning and didn't feel well

## 2024-04-21 ENCOUNTER — Other Ambulatory Visit (INDEPENDENT_AMBULATORY_CARE_PROVIDER_SITE_OTHER)

## 2024-04-21 ENCOUNTER — Ambulatory Visit (INDEPENDENT_AMBULATORY_CARE_PROVIDER_SITE_OTHER): Admitting: Internal Medicine

## 2024-04-21 VITALS — BP 180/118 | HR 85 | Ht 67.5 in | Wt 165.0 lb

## 2024-04-21 DIAGNOSIS — R601 Generalized edema: Secondary | ICD-10-CM

## 2024-04-21 DIAGNOSIS — Z9889 Other specified postprocedural states: Secondary | ICD-10-CM

## 2024-04-21 DIAGNOSIS — I10 Essential (primary) hypertension: Secondary | ICD-10-CM

## 2024-04-21 DIAGNOSIS — Z8679 Personal history of other diseases of the circulatory system: Secondary | ICD-10-CM

## 2024-04-21 LAB — ECG 12-LEAD
Atrial Rate: 85 {beats}/min
P Axis: 81 degrees
P-R Interval: 164 ms
Q-T Interval: 342 ms
QRS Duration: 84 ms
QTC Calculation (Bezet): 406 ms
R Axis: 72 degrees
T Axis: 74 degrees
Ventricular Rate: 85 {beats}/min

## 2024-04-21 MED ORDER — HYDRALAZINE HCL 25 MG PO TABS: 25.0000 mg | ORAL_TABLET | Freq: Two times a day (BID) | ORAL | 3 refills | Status: AC

## 2024-04-21 NOTE — Progress Notes (Signed)
 [facility]  ([id number]) [phone]  [email or web address]       Patient:  Audrey Gilmore  April 21, 2024    Referring Provider:  No referring provider defined for this encounter.    Primary Care Provider:  Valley Laquetta LABOR, MD  [address]  [place] [address]     Assessment & Plan:    Assessment:  Audrey Gilmore is a 68 y.o. female with history of atrial fibrillation status post atrial fibrillation ablation who presents for management of:    1.) Atrial fibrillation  She is status post atrial fibrillation ablation, and has occasional palpitations and dizziness.  Some of it she attributes to her diltiazem, which she had doubled for blood pressure management. She also endorsed an occasional tugging sensation at her heart.  We ordered a patch monitor for 2 weeks and will also have her undergo echocardiogram for structural heart issues.    2.) Hypertension  Audrey Gilmore has significant hypertension today, though she notes that this is somewhat labile and has pre-dated her ablation.  She will continue on diltiazem 120 mcg.  She has had bad reactions to metoprolol and has had angioedema from lisinopril.  She would like to avoid an ARB.  WE will refer her to see Audrey Gilmore for BP management and will also prescribe her hydralazine  25 mg BID (per Dr. Rayma suggestion).      Plan:  Stop pantoprazole   Start hydralazine  25 mg BID  Continue diltiazem 120 mg once daily  Refer to interventional cardiology Evelina) for BP management, including consideration for renal denervation given intolerance to multiple drugs  Echocardiogram for palpitations and chest discomfort  2 week MCOT for palpitations  Stroke Prevention: apixaban  5mg  PO bid  Monitoring:   symptoms  Risk factors:  no interventions needed    Follow Up: 2 months    I have personally spent 45 minutes involved in face-to-face and nonface-to-face activities for this patient on the day of the visit (not including time spent documenting  and/or signing the encounter after the day of the visit)  ________________________________    History of Present Illness:    The patient is a 68 y.o. year-old female with a history of chronic inflammatory demyelinating polyneuropathy.      History of Present Illness  Audrey Gilmore is a 68 year old female who presents with dizziness and edema following an ablation procedure.    She has been experiencing significant dizziness for the past two weeks, described as a sensation of nearly passing out. This dizziness is more severe than her baseline dizziness associated with chronic inflammation. The dizziness worsened after she accidentally took a double dose of diltiazem. Despite adjusting her medication schedule, the dizziness persists.    She reports edema in her legs, which she feels may be related to the use of pantoprazole  and wonders if the ablation could also be a cause. The edema has not resolved since the procedure.    She describes a burning sensation across her chest, akin to a 'sunburn', and notes that she read this is common after ablation. This sensation has improved slightly over time.    She has a history of severe cervical spine issues, including foraminal narrowing and bulging discs, which have required hospitalization for pain management. She was unable to receive an epidural injection due to an allergy to IV contrast. As a result, she has been sleeping sitting up for the past four months due to pressure-related discomfort.  She has a history of adverse reactions to several medications, including angioedema with lisinopril and poor tolerance to metoprolol, which caused spasms and nightmares. She is currently taking diltiazem but reports it 'zaps' her energy. She avoids medications and substances that could exacerbate her symptoms, such as decongestants and caffeine.    She mentions a history of thoracic spine injury and a past suspicion of a spontaneous spinal fluid leak, which was never  confirmed. She has been managing her pain with tramadol , which she tolerates well.      REVIEW OF SYMPTOMS:  Review of Systems is negative aside from that mentioned above in the HPI    Arrhythmia History:    1). Ambulatory monitor: no    2). Anti-arrhythmic drug history:     Start Stop Dose/Notes   Flecainide      Propafenone      Sotalol      Dofetilide      Dronedarone      Amiodarone      BetaBlocker      CaBlocker      Digoxin        3). Prior EP Ablation History:    Date Procedure Details                                          Medications:  Current Medications[1]    Allergies:  Allergies[2]    Social History:  Social History[3]    Family History:  Family History[4]  No known family history of sudden cardiac death or unexplained death before 70 years of age.  Family history otherwise non-contributory.    ROS:  General: negative for fatigue, fever or night sweats  Respiratory: no cough, shortness of breath, or wheezing  Cardiovascular: no chest pain or dyspnea on exertion  Gastrointestinal: no abdominal pain, change in bowel habits, or black or bloody stools  Musculoskeletal: negative for joint pain or muscular weakness  Neurological: no TIA or stroke symptoms    Review of systems otherwise negative except as indicated above in the history.    Physical Exam:   Physical Exam    Vitals: BP (!) 180/118 (BP Site: Left arm, Patient Position: Sitting, Cuff Size: Medium)   Pulse 85   Ht 1.715 m (5' 7.5)   Wt 74.8 kg (165 lb)   BMI 25.46 kg/m    BMI: Body mass index is 25.46 kg/m.   General Appearance:  Appears well, no distress; Alert and oriented x 3   Neck: Carotids 2+ bilaterally, JVP not distended   Lungs:   No increased work of breathing.     Heart:  normal rate, regular rhythm, normal S1, S2, no murmurs, rubs, clicks or gallops   Abdomen:   Not examined   Extremities: Extremities normal, no cyanosis. No lower extremity edema bilaterally on visual exam.   Pulses: Not examined   Skin: No rashes or ulcers on  visual exam   Neurologic: Alert, interactive, and appropriate, grossly moving all 4 extremities         RELEVANT LABORATORY FINDINGS:  Lab Results   Component Value Date    WBC 6.83 03/11/2024    WBC 9.75 06/12/2016    HGB 13.6 03/11/2024    HGB 10.9 (L) 06/12/2016    PLT 307 03/11/2024    PLT 209 06/12/2016    NA 136 03/11/2024    NA 141 06/12/2016  K 4.7 03/11/2024    K 3.9 06/12/2016    CREAT 0.9 03/11/2024    CREAT 0.8 06/12/2016       Results      CHADS-VASc: 2     12 lead ECG: rhythm: normal sinus rhythm ventricular rate of 85 bpm.  No ST segment or T-wave abnormalities.    ____________________  Fidela Cassis, MD PhD  April 21, 2024      CC LIST:    Valley Laquetta LABOR, MD  [address]  [place] [address]  [phone]  [phone]  No referring provider defined for this encounter.                             [1]   Current Outpatient Medications   Medication Sig Dispense Refill    acetaminophen  (TYLENOL ) 650 MG CR tablet Take 1 tablet (650 mg) by mouth every 8 (eight) hours as needed for Pain      apixaban  (ELIQUIS ) 5 MG tablet Take 1 tablet (5 mg) by mouth every 12 (twelve) hours 180 tablet 3    Cyanocobalamin 1000 MCG/ML Kit Inject as directed      dilTIAZem (CARDIZEM CD) 120 MG 24 hr capsule Take 1 capsule (120 mg) by mouth      LORazepam  (ATIVAN ) 0.5 MG tablet Take 1 tablet (0.5 mg) by mouth daily (Patient taking differently: Take 1 tablet (0.5 mg) by mouth 4 (four) times daily)      traMADol  (ULTRAM ) 50 MG tablet TAKE 2 TABLETS BY MOUTH EVERY 8 HOURS   Takes BID (Patient taking differently: Take 2 tablets (100 mg) by mouth Takes bid to TID)  1    Cholecalciferol 50 MCG (2000 UT) Tab Take 2,000 Units by mouth (Patient not taking: Reported on 04/20/2024)      hydrALAZINE  (APRESOLINE ) 25 MG tablet Take 1 tablet (25 mg) by mouth 2 (two) times daily 60 tablet 3     No current facility-administered medications for this visit.   [2]   Allergies  Allergen Reactions    Bactrim  [Sulfamethoxazole-Trimethoprim] Hives and Itching     Had clindamycin at the same time and does not know which one caused the allergic reaction      Cephalexin Itching    Cephalosporins Hives and Itching    Ciprofloxacin Other (See Comments)     Severe joint pain    Clindamycin Hives and Itching    Dye [Iodinated Contrast Media] Itching and Other (See Comments)     Sneezing, itching    Levaquin [Levofloxacin] Other (See Comments)     Severe joint pain    Lipitor [Atorvastatin] Other (See Comments)     Muscle ache/spasms/cramps  Headache    Lisinopril Swelling    Lidocaine  Palpitations   [3]   Social History  Socioeconomic History    Marital status: Married   Tobacco Use    Smoking status: Former     Current packs/day: 0.00     Types: Cigarettes     Start date: 05/31/1975     Quit date: 05/31/1983     Years since quitting: 40.9    Smokeless tobacco: Never   Vaping Use    Vaping status: Never Used   Substance and Sexual Activity    Alcohol use: Not Currently     Comment: < weekly basis    Drug use: Never     Social Drivers of Health And Safety Inspector  Resource Strain: Low Risk (04/19/2024)    Overall Financial Resource Strain (CARDIA)     Difficulty of Paying Living Expenses: Not hard at all   Food Insecurity: No Food Insecurity (04/19/2024)    Hunger Vital Sign     Worried About Running Out of Food in the Last Year: Never true     Ran Out of Food in the Last Year: Never true   Transportation Needs: No Transportation Needs (04/19/2024)    PRAPARE - Therapist, Art (Medical): No     Lack of Transportation (Non-Medical): No   Physical Activity: Sufficiently Active (04/19/2024)    Exercise Vital Sign     Days of Exercise per Week: 5 days     Minutes of Exercise per Session: 40 min   Stress: Stress Concern Present (04/19/2024)    [employer] of Occupational Health - Occupational Stress Questionnaire     Feeling of Stress : To some extent   Social Connections: Unknown (10/22/2023)    Social Connection  and Isolation Panel     Frequency of Communication with Friends and Family: More than three times a week     Frequency of Social Gatherings with Friends and Family: Once a week     Attends Religious Services: Patient declined     Database Administrator or Organizations: Yes     Attends Banker Meetings: Patient declined     Marital Status: Married   Catering Manager Violence: Not At Risk (04/19/2024)    Humiliation, Afraid, Rape, and Kick questionnaire     Fear of Current or Ex-Partner: No     Emotionally Abused: No     Physically Abused: No     Sexually Abused: No   Housing Stability: Not At Risk (04/19/2024)    Housing Stability NCSS     Do you have housing?: Yes     Are you worried about losing your housing?: No   [4]   Family History  Problem Relation Name Age of Onset    Bipolar disorder Brother youngest  daughter 30 - 4        was first diagnosed during Covid while living in [place], had  a sudden psychosis    Coronary artery disease Brother youngest  daughter         heart attack age 79

## 2024-04-21 NOTE — Patient Instructions (Signed)
 1.) please stop your pantoprazole   2.) start hydralazine  25 mg twice daily.   3.) continue diltiazem 120 mcg once daily  4.) please send in the monitor when you finish wearing it  5.) please get an echocardiogram (ultrasound)

## 2024-04-22 ENCOUNTER — Other Ambulatory Visit (INDEPENDENT_AMBULATORY_CARE_PROVIDER_SITE_OTHER): Payer: Self-pay | Admitting: Internal Medicine

## 2024-04-22 DIAGNOSIS — I10 Essential (primary) hypertension: Secondary | ICD-10-CM

## 2024-04-26 ENCOUNTER — Encounter (INDEPENDENT_AMBULATORY_CARE_PROVIDER_SITE_OTHER): Payer: Self-pay | Admitting: Internal Medicine

## 2024-04-27 ENCOUNTER — Encounter (INDEPENDENT_AMBULATORY_CARE_PROVIDER_SITE_OTHER): Payer: Self-pay | Admitting: Internal Medicine

## 2024-04-27 ENCOUNTER — Telehealth (INDEPENDENT_AMBULATORY_CARE_PROVIDER_SITE_OTHER): Payer: Self-pay

## 2024-04-27 NOTE — Telephone Encounter (Signed)
 Pt had Afib for 8 minutes yesterday HR 91-160. Pt messaged us  yesterday regarding Afib- was dizzy and felt a sunburn. Pt also had bloating and edema    Pt s/p ablation 11/12

## 2024-04-27 NOTE — Telephone Encounter (Signed)
 Pt calling back. (See mychart message)

## 2024-04-27 NOTE — Telephone Encounter (Signed)
 Called pt-LVM- relayed EY message and educated pt on blanking period.

## 2024-04-29 ENCOUNTER — Telehealth (INDEPENDENT_AMBULATORY_CARE_PROVIDER_SITE_OTHER): Payer: Self-pay

## 2024-04-29 NOTE — Telephone Encounter (Signed)
 Left message for patient to call back to schedule RDN consult with Dr. Noelle as recommended by Dr. Diona.

## 2024-05-05 NOTE — Telephone Encounter (Signed)
 Pt LM that was difficult to hear/understand.

## 2024-05-05 NOTE — Telephone Encounter (Signed)
 Received secure chat from cardiology that pt trying to reach us  with symptoms of Afib. Called pt. LVM to call back with symptoms.    Pt had ablation 03/25/24

## 2024-05-05 NOTE — Telephone Encounter (Signed)
 Called pt. Pt states she was at her daughter for Shannon celebration for the last few days. Went to urgent care for a sun burn feeling in her chest (see messge from 12/17). Pt still having the chest pain burning in her chest 36 days after the ablation. Pt has PCP appt next week. Pt has an Echo scheduled for tomorrow. Pt is going to [facility] ER. I advised her to go to an [place] [facility] ER but she does not want to. She is unsure what ER she is going to. Asked the patient to keep us  updated. She will update us       Pt was advise to go to the ER 12/17 due to symptoms- leg swelling and burning in chest

## 2024-05-06 ENCOUNTER — Ambulatory Visit (INDEPENDENT_AMBULATORY_CARE_PROVIDER_SITE_OTHER)

## 2024-05-06 DIAGNOSIS — Z8679 Personal history of other diseases of the circulatory system: Secondary | ICD-10-CM

## 2024-05-06 DIAGNOSIS — Z9889 Other specified postprocedural states: Secondary | ICD-10-CM

## 2024-05-06 DIAGNOSIS — R601 Generalized edema: Secondary | ICD-10-CM

## 2024-05-08 LAB — ECHO ADULT TTE COMPLETE
AV Area (Cont Eq VTI): 2.1684
AV Mean Gradient: 5
AV Peak Velocity: 1.52
Ao Root Diameter (2D): 2.9
BP Mod LV Ejection Fraction: 68
IVS Diastolic Thickness (2D): 1.06
LA Dimension (2D): 3.8
LA Volume Index (BP A-L): 36.0253
LVID diastole (2D): 3.52
LVID systole (2D): 2.34
MV E/A: 1.0619
MV E/e' (Average): 19.2526
Mitral Valve Findings: NORMAL
Prox Ascending Aorta Diameter: 3.1
RV Basal Diastolic Dimension: 2.54
TAPSE: 2.54
Tricuspid Valve Findings: NORMAL

## 2024-05-08 NOTE — Telephone Encounter (Signed)
 LVM asking for update- did patient go to ER? LVM with ER precautions if she is still having symptoms.

## 2024-05-08 NOTE — Telephone Encounter (Signed)
 Patient returned phone call today.  Please return call on 05/11/24.  Per patient, please allow phone to ring several times to ensure patient is able to answer.

## 2024-05-09 ENCOUNTER — Emergency Department

## 2024-05-09 ENCOUNTER — Emergency Department
Admission: EM | Admit: 2024-05-09 | Discharge: 2024-05-09 | Disposition: A | Discharge status: Home / Self Care | Attending: Student in an Organized Health Care Education/Training Program | Admitting: Student in an Organized Health Care Education/Training Program

## 2024-05-09 DIAGNOSIS — R0789 Other chest pain: Secondary | ICD-10-CM | POA: Insufficient documentation

## 2024-05-09 DIAGNOSIS — Z7901 Long term (current) use of anticoagulants: Secondary | ICD-10-CM | POA: Insufficient documentation

## 2024-05-09 DIAGNOSIS — R600 Localized edema: Secondary | ICD-10-CM | POA: Insufficient documentation

## 2024-05-09 DIAGNOSIS — R000 Tachycardia, unspecified: Secondary | ICD-10-CM | POA: Insufficient documentation

## 2024-05-09 DIAGNOSIS — R079 Chest pain, unspecified: Secondary | ICD-10-CM

## 2024-05-09 HISTORY — DX: Chronic inflammatory demyelinating polyneuritis: G61.81

## 2024-05-09 LAB — NT-PROBNP: NT-ProBNP: 97.5 pg/mL (ref <=334.1)

## 2024-05-09 LAB — COMPREHENSIVE METABOLIC PANEL
ALT: 18 U/L (ref <=55)
AST (SGOT): 28 U/L (ref <=41)
Albumin/Globulin Ratio: 1.2 (ref 0.9–2.2)
Albumin: 3.9 g/dL (ref 3.5–4.9)
Alkaline Phosphatase: 97 U/L (ref 37–117)
Anion Gap: 7 (ref 5.0–15.0)
BUN: 17 mg/dL (ref 7–21)
Bilirubin, Total: 0.5 mg/dL (ref 0.2–1.2)
CO2: 27 meq/L (ref 17–29)
Calcium: 9.9 mg/dL (ref 7.9–10.2)
Chloride: 103 meq/L (ref 99–111)
Creatinine: 0.8 mg/dL (ref 0.4–1.0)
GFR: >60 mL/min/1.73 m2 (ref >=60.0)
Globulin: 3.3 g/dL (ref 2.0–3.6)
Glucose: 94 mg/dL (ref 70–100)
Potassium: 4 meq/L (ref 3.5–5.3)
Protein, Total: 7.2 g/dL (ref 6.0–8.3)
Sodium: 137 meq/L (ref 135–145)

## 2024-05-09 LAB — LAB USE ONLY - CBC WITH DIFFERENTIAL
Absolute Basophils: 0.07 x10 3/uL (ref 0.00–0.08)
Absolute Eosinophils: 0.19 x10 3/uL (ref 0.00–0.44)
Absolute Immature Granulocytes: 0.01 x10 3/uL (ref 0.00–0.07)
Absolute Lymphocytes: 2.3 x10 3/uL (ref 0.42–3.22)
Absolute Monocytes: 0.37 x10 3/uL (ref 0.21–0.85)
Absolute Neutrophils: 3.39 x10 3/uL (ref 1.10–6.33)
Absolute nRBC: 0 x10 3/uL (ref <=0.00)
Basophils %: 1.1 %
Eosinophils %: 3 %
Hematocrit: 38.2 % (ref 34.7–43.7)
Hemoglobin: 13 g/dL (ref 11.4–14.8)
Immature Granulocytes %: 0.2 %
Lymphocytes %: 36.3 %
MCH: 30.8 pg (ref 25.1–33.5)
MCHC: 34 g/dL (ref 31.5–35.8)
MCV: 90.5 fL (ref 78.0–96.0)
MPV: 10.2 fL (ref 8.9–12.5)
Monocytes %: 5.8 %
Neutrophils %: 53.6 %
Platelet Count: 282 x10 3/uL (ref 142–346)
Preliminary Absolute Neutrophil Count: 3.39 x10 3/uL (ref 1.10–6.33)
RBC: 4.22 x10 6/uL (ref 3.90–5.10)
RDW: 12 % (ref 11–15)
WBC: 6.33 x10 3/uL (ref 3.10–9.50)
nRBC %: 0 /100{WBCs} (ref <=0.0)

## 2024-05-09 LAB — HIGH SENSITIVITY TROPONIN-I WITH DELTA: hs Troponin: <2.7 ng/L (ref <=14.0)

## 2024-05-09 LAB — WHOLE BLOOD GLUCOSE POCT: Whole Blood Glucose POCT: 98 mg/dL (ref 70–100)

## 2024-05-09 LAB — HIGH SENSITIVITY TROPONIN-I: hs Troponin: <2.7 ng/L (ref <=14.0)

## 2024-05-09 NOTE — ED Triage Notes (Signed)
 Pt reports feeling like she had a sunburn under her skin with chest tightness and pressure for 3-4 days. Pain non-radiating. Endorses heart jumping around intermittently in the last few days. Hx of Afib/Aflutter, but had ablation to treat it Nov 12th of this year. Denies N/V. Endorses lightheadedness in the last few days, denies it at triage. Aox4, resp even and unlabored at triage. Reports taking her nightly extended release diltiazem dose 120mg  at 1930 today.

## 2024-05-09 NOTE — ED Provider Notes (Signed)
 [facility]  Emergency Department APP Note      Diagnosis/Disposition     ED Disposition:  Discharge    ED Diagnosis:  Chest pain, unspecified type    Discharge Medication List as of 05/09/2024 11:10 PM            History of Present Illness      Nursing Triage Note: Pt ambulatory to ED, sent by EP and PCP, c/o dizziness, CP, and chest burning/tightness since ablation 4.5 wks ago. Pt reports pain is worse when reclining and worse at night. EP had her wear a monitor but pt states pain was significantly worse today. Reports having echo done 2 days ago and states the sono transducer on her chest was excruciating. +Eliquis  for afib. Speaking in clear, full sentences. AO x4, resp even and unlabored.  Chief Complaint: Chest Pain    History of Present Illness  Audrey Gilmore is a 68 year old female with hx of atrial fibrillation, s/p ablation 03/25/24, HTN, HLD, R carotid artery stenosis s/p CEA (7 years ago), who presents with chest pressure and leg swelling post-ablation. She was referred by Dr. Diona for evaluation of chest pressure and leg swelling post-ablation.    She underwent ablation for atrial fibrillation and atrial flutter on March 25, 2024. She did well for about 1 to 2 weeks, then developed bloating and significant leg edema. About 3 weeks post-ablation she developed chest pressure wrapping around her chest and shoulder that worsens when lying flat and improves when upright. Today it is worse and feels like a burning sunburn under the skin.    She has had palpitations, intermittent shortness of breath. She felt lightheaded and nearly passed out 1 to 2 days ago.     She takes tramadol  daily for spine pain, which sometimes decreases the chest pain, and takes Eliquis  twice daily for atrial fibrillation. She reports chronic inflammatory leg pain and marked leg swelling since the ablation.      Physical Exam     Pulse (!) 107  BP (!) 158/99  Resp 18  SpO2 96 %  Temp 98 F (36.7  C)     Physical Exam  GENERAL: Alert, cooperative, well developed, no acute distress  HEENT: Normocephalic, normal oropharynx, moist mucous membranes  CHEST: Clear to auscultation bilaterally, No wheezes, rhonchi, or crackles, no chest wall tenderness   CARDIOVASCULAR: Normal heart rate and rhythm, S1 and S2 normal without murmurs  ABDOMEN: Soft, non-tender, non-distended  EXTREMITIES: No cyanosis, mild nonpitting edema at BLE, posterior tibial pulses equal and brisk, cap refill <2sec  NEUROLOGICAL: Cranial nerves grossly intact, Moves all extremities without gross motor or sensory deficit        Medical Decision Making        Medical Decision Making  68 year old female with history of atrial fibrillation and atrial flutter status post ablation on November 12 presents with progressive lower extremity edema, chest pressure that worsens when reclined and improves when upright, intermittent palpitations, shortness of breath, and a recent episode of near-syncope. She also experienced a transient chest rash post-ablation, now resolved, and has a history of carotid endarterectomy seven years ago. Exam reveals lower mild BLE swelling, with one side more inflamed, and no current rash. She denies new medications except those administered during the procedure and has no known heart failure.    Pt is tachycardic on initial evaluation with HR of 107 but this normalized with rest. Vitals otherwise WNL.     Differential diagnosis  includes, but is not limited to:  - ACS, pericarditis, HF, Post-ablation complications      CHART REVIEW:  - Nursing notes and triage notes reviewed.    ED Course as of 05/10/24 0014   Sat May 09, 2024   2055 Pt is taking her own eliquis  5mg   [VS]   2056 Pt c/o pressure like pain across middle of the chest since her ablation and worsening pain since yesterday. She is followed by cardiologist Dr. Diona [VS]   2141 High Sensitivity Troponin-I [VS]   2144  - EKG: I have reviewed and independently  interpreted the patient's EKG as: Normal sinus rhythm at 100. Normal axis. Normal PR, QRS, QTc interval. No ST changes. [JA]   2303 Discussed results with patient and husband at bedside.  Shared decision making was established, patient was offered hospital admission versus close follow-up with cardiologist/electrophysiologist.  At this time, she declined offer for hospital admission and wishes to discharge to home.  She is in stable and good condition, NAD, at time of disposition. We discussed strict ED return precautions and need for f/u with electrophysiologist in the next 1-2 days. She was agreeable and comfortable with plans and to discharge to home.  [VS]      ED Course User Index  [JA] Renne Purchase, MD  [VS] Walker, [place], [place]                 Interpretations   O2 Sat:  The patient's oxygen saturation was 96 % on room air. This was independently interpreted by me as Normal.       EKG: I reviewed and independently interpreted the patient's EKG as: normal sinus at ventricular rate 100.  Occasional PACs, no STEMI, no acute ischemia.          Radiology: All available radiologist's interpretations were reviewed if not separately interpreted by me.     Heart Score      Flowsheet Row Value   History 0   EKG 0   Risk Factors 2   Total (with age) 4   Onset of pain (time of START of last episode of chest pain)? --         Procedures      Procedures      Supplemental Encounter Data   Medical History[7]  Past Surgical History[8]  Social History[9]  Family History[10]  Allergies[11]    Medications Administered:  Medications - No data to display  Laboratory and Imaging Studies:  Results for orders placed or performed during the hospital encounter of 05/09/24 (from the past 24 hours)   Comprehensive Metabolic Panel    Collection Time: 05/09/24  9:30 PM   Result Value    Glucose 94    BUN 17    Creatinine 0.8    Sodium 137    Potassium 4.0    Chloride 103    CO2 27    Calcium 9.9    Anion Gap 7.0    GFR >60.0    AST  (SGOT) 28    ALT 18    Alkaline Phosphatase 97    Albumin 3.9    Protein, Total 7.2    Globulin 3.3    Albumin/Globulin Ratio 1.2    Bilirubin, Total 0.5   High Sensitivity Troponin-I    Collection Time: 05/09/24  9:30 PM   Result Value    hs Troponin <2.7   CBC with Differential (Component)    Collection Time: 05/09/24  9:30 PM  Result Value    WBC 6.33    Hemoglobin 13.0    Hematocrit 38.2    Platelet Count 282    MPV 10.2    RBC 4.22    MCV 90.5    MCH 30.8    MCHC 34.0    RDW 12    nRBC % 0.0    Absolute nRBC 0.00    Preliminary Absolute Neutrophil Count 3.39    Neutrophils % 53.6    Lymphocytes % 36.3    Monocytes % 5.8    Eosinophils % 3.0    Basophils % 1.1    Immature Granulocytes % 0.2    Absolute Neutrophils 3.39    Absolute Lymphocytes 2.30    Absolute Monocytes 0.37    Absolute Eosinophils 0.19    Absolute Basophils 0.07    Absolute Immature Granulocytes 0.01   High Sensitivity Troponin-I with calculated Delta    Collection Time: 05/09/24  9:52 PM   Result Value    hs Troponin <2.7    hs Troponin-I Delta     Collection Time: 05/09/24  9:52 PM   Result Value    NT-ProBNP 97.5    Collection Time: 05/09/24  9:57 PM   Result Value    Whole Blood Glucose POCT 98     Chest AP Portable   Final Result      No focal consolidation.      Pauline Frederick, MD   05/09/2024 10:16 PM                  [7]   Past Medical History:  Diagnosis Date    Arthritis     Atrial fibrillation (CMS/HCC) 2024    Atrial flutter (CMS/HCC) 2018    after carotid endarterectomy    Balance problem     Bursitis     Carotid stenosis 2018    right    Carotid stenosis, asymptomatic, right 05/16/2016    Chronic pain     Follows w/Pain Management for many years    CIDP (chronic inflammatory demyelinating polyneuropathy) (CMS/HCC)     Claustrophobia ?    Coronary artery disease 2018    carotid artery endarterectomy    Dizziness ?    Dysphagia     Gastroesophageal reflux disease     Headache     Hearing loss     Hiatal hernia      Hypercholesterolemia     Hypertension 2018    able blood pressure    Hyponatremia     Low back pain     Memory loss ?    came on gradually over the years    Neuropathy     neuropathy    Numbness     Osteopenia     Pain     Palpitations     Due to dehydration/increased caffeine intake    Paroxysmal SVT (supraventricular tachycardia)     Last episode ~1992    Peripheral arterial disease     dibt remember    Pins and needles sensation     Post-operative nausea and vomiting     Severe Nausea    Weakness    [8]   Past Surgical History:  Procedure Laterality Date    ABLATION - EPICARDIAL N/A 03/25/2024    Procedure: AFFERA  only Ablation - Atrial Fibrillation;  Surgeon: Diona Genre, MD PhD;  Location: [facility];  Service: Cardiovascular;  Laterality: N/A;    CARDIAC CATHETERIZATION  2012    CAROTID  ENDARTERECTOMY  2018    CHOLECYSTECTOMY      COLONOSCOPY, DIAGNOSTIC (SCREENING)      ENDARTERECTOMY, CAROTID ARTERY Right 06/11/2016    Procedure: ENDARTERECTOMY, CAROTID ARTERY WITH PATCH ANGIOPLASTY;  Surgeon: Aurelio Arvis LABOR, MD;  Location: [facility];  Service: Cardiovascular;  Laterality: Right;    EPIDURAL STEROID INJECTION      knees, scapula, frozen shoulder, thumb    HYSTERECTOMY      TONSILLECTOMY      TUBAL LIGATION     [9]   Social History  Tobacco Use    Smoking status: Former     Current packs/day: 0.00     Types: Cigarettes     Start date: 05/31/1975     Quit date: 05/31/1983     Years since quitting: 40.9    Smokeless tobacco: Never   Vaping Use    Vaping status: Never Used   Substance Use Topics    Alcohol use: Not Currently     Comment: < weekly basis    Drug use: Never   [10]   Family History  Problem Relation Name Age of Onset    Bipolar disorder Brother youngest  daughter 70 - 37        was first diagnosed during Covid while living in [place], had  a sudden psychosis    Coronary artery disease Brother youngest  daughter         heart attack age 18   [11]   Allergies  Allergen Reactions     Bactrim [Sulfamethoxazole-Trimethoprim] Hives and Itching     Had clindamycin at the same time and does not know which one caused the allergic reaction      Cephalexin Itching    Cephalosporins Hives and Itching    Ciprofloxacin Other (See Comments)     Severe joint pain    Clindamycin Hives and Itching    Dye [Iodinated Contrast Media] Itching and Other (See Comments)     Sneezing, itching    Levaquin [Levofloxacin] Other (See Comments)     Severe joint pain    Lipitor [Atorvastatin] Other (See Comments)     Muscle ache/spasms/cramps  Headache    Lisinopril Swelling    Lidocaine  Palpitations        Szeto, Vicky, PA  05/10/24 0014       Renne Purchase, MD  05/13/24 0900

## 2024-05-09 NOTE — EDIE (Signed)
 [facility] NOTIFICATION 05/09/2024 18:58 Cregan, KAMA CAMMARANO DOB: 06/13/1955 MRN: [id number]    Criteria Met      5 ED Visits in 12 Months    Narx Scores Alert    Security and Safety  No Security Events were found.  ED Care Guidelines  There are currently no ED Care Guidelines for this patient. Please check your facility's medical records system.        Prescription Monitoring Program  531 ??- Narcotic Use Score   471 ??- Sedative Use Score   000 ??- Stimulant Use Score   160??- Overdose Risk Score  - All Scores range from 000-999 with 75% of the population scoring < 200 and only 1% scoring above 650  - The last digit of the narcotic, sedative, and stimulant score indicates the number of active prescriptions of that type  - Higher Use scores correlate with increased prescribers, pharmacies, mg equiv, and overlapping prescriptions   - Higher Overdose Risk Scores correlate with increased risk of unintentional overdose death   Concerning or unexpectedly high scores should prompt a review of the PMP record; this does not constitute checking PMP for prescribing purposes.    E.D. Visit Count (12 mo.)  Facility Visits   [facility]  -[facility] 8   [facility] 3   [facility] 3   [facility] 1   Total 15   Note: Visits indicate total known visits.     Recent Emergency Department Visit Summary  Showing 10 most recent visits out of 15 in the past 12 months   Date Facility [facility] Type Diagnoses or Chief Complaint    May 09, 2024  Ester H.  Falls.  [place]  Emergency      triage      Oct 03, 2023  [facility] [place]  -Viviann HUNT  Staff.  [place]  Emergency      Pain, unspecified      Other chronic pain      Chronic inflammatory demyelinating polyneuritis      Cervicalgia      Essential (primary) hypertension      Neck Pain      Sep 20, 2023  [facility] [place]  -Viviann DEL.  Staff.  [place]  Emergency      Chronic pain syndrome      Itching      back pain      Sep 03, 2023   [facility] [place]  H.  Frede.  [place]  Emergency      Cervicalgia      Neck Pain      Arm Pain      Allergic Reaction      Sep 01, 2023  [facility] [employer]  M.C.  Charl.  [place]  Emergency      Chronic inflammatory demyelinating polyneuritis      Allergic Reaction      allergic reaction      Aug 18, 2023  Mary [place]  H.  A[place]y.  [place]  Emergency      Headache, unspecified      Dehydration      Dorsalgia, unspecified      Back Pain      Numbness      Tingling      Headache      Numbness/Tingling      Aug 06, 2023  [facility] [employer]  Charl.  [place]  Emergency      Benign paroxysmal vertigo, left ear  Unspecified complication following infusion and therapeutic injection, initial encounter      Dizziness      Aug 03, 2023  [facility] [place]  -Viviann DEL.  Staff.  [place]  Emergency      Paroxysmal atrial fibrillation      Palpitations      Neck Pain      Abdominal/Stomach Pain      Jul 26, 2023  [facility] [place]  -Viviann DEL.  Staff.  [place]  Emergency      Epigastric pain      Chest pain, unspecified      Other muscle spasm      Spasms      Abdominal Pain      Anxiety      Dizziness/Fainting      Jul 24, 2023  [facility] [employer]  [place].  [place]  Emergency      Gastrophageal Reflux      Spasms      Abdominal Pain        Recent Inpatient Visit Summary  Date Facility [facility] Type Diagnoses or Chief Complaint    Oct 03, 2023  [facility] [place]  -Viviann DEL.  Staff.  [place]  General Medicine      Pain, unspecified      Chronic inflammatory demyelinating polyneuritis      Other chronic pain      Essential (primary) hypertension      Cervicalgia      Sep 20, 2023  [facility] [place]  -Viviann DEL.  Staff.  [employer]  Cardiovacular Care Unit      Chronic pain syndrome      Jul 10, 2023  [place]onal [place]  -Viviann DEL.  Staff.  [place]  Cardiovacular Care Unit      Chest pain, unspecified      Palpitations      Paroxysmal atrial fibrillation      Jun 04, 2023  [facility] [place]  -Viviann DEL.  Staff.    Cardiovacular Care Unit      Supraventricular tachycardia,  unspecified      Pain in thoracic spine      Palpitations      Other chronic pain      Hypertensive urgency      Paroxysmal atrial fibrillation        Care Team  No Care Team was found.  [facility]  This patient has registered at the [facility] Emergency Department  For more information visit: [email or web address]    [email or web address]     PLEASE NOTE:     1.   Any care recommendations and other clinical information are provided as guidelines or for historical purposes only, and providers should exercise their own clinical judgment when providing care.    2.   You may only use this information for purposes of treatment, payment or health care operations activities, and subject to the limitations of applicable [facility] Policies.    3.   You should consult directly with the organization that provided a care guideline or other clinical history with any questions about additional information or accuracy or completeness of information provided.    ? 2025 [facility] - [email or web address]

## 2024-05-09 NOTE — Discharge Instructions (Addendum)
 Thank you for visiting our ED today.  You were seen for concerns of chest pain, and your EKG, blood work, and chest x-ray are reassuring.  Please follow-up closely with your electrophysiologist in the next 1 to 2 days. If you experience new or worsening symptoms as discussed, return to the ED immediately.

## 2024-05-09 NOTE — ED Notes (Signed)
 Pt taking nightly home dose of 5mg  eliquis , okayed by PA Vicki Szeto

## 2024-05-10 LAB — ECG 12-LEAD
Atrial Rate: 100 {beats}/min
P Axis: 72 degrees
P-R Interval: 150 ms
Q-T Interval: 344 ms
QRS Duration: 90 ms
QTC Calculation (Bezet): 443 ms
R Axis: 76 degrees
T Axis: 73 degrees
Ventricular Rate: 100 {beats}/min

## 2024-05-11 ENCOUNTER — Ambulatory Visit (INDEPENDENT_AMBULATORY_CARE_PROVIDER_SITE_OTHER): Payer: Self-pay | Admitting: Internal Medicine

## 2024-05-11 NOTE — Progress Notes (Signed)
 [facility]    [address] [place],  [address] [place], [place] [address]  Phone : [phone] Fax: [phone]       OFFICE PROGRESS NOTE    Date Time: 05/11/2024 6:01 PM  Patient Name: Audrey Gilmore     Chief Complaints:     Chief Complaint   Patient presents with    Follow-up     Subjective:     Ariane Ditullio Danish is a 68 y.o. female with multiple medical problems as stated below who presented to the office today for follow-up from recent ER visit on 05/09/2024 where she went for chest pressure and tightness.  Her workup was unremarkable and was advised to follow with her primary care physician and Cardiology.  I went over the hospital records including labs as well as ER consultation notes.  Apparently patient had atrial fibrillation ablation recently by Cardiology on 03/25/2024.  She has a an upcoming follow-up with Cardiology.  Patient has been saying that she has been having hypoglycemic episodes and would like to get a CGM.  I am not sure if her insurance covers CGM for hypoglycemia I told her but I will send a prescription for her and I gave her a sample of CGM from our office.    HISTORY:   PAST MEDICAL HISTORY:  has a past medical history of Ankle fracture, Ankle sprain, Arthritis, Bulging lumbar disc, Bursitis of hip, Bursitis of shoulder, Carotid stenosis, Central pain syndrome, Cervical disc disorder, Cervical spondylosis, Colon polyp, Concussion (1962), COVID (10/2019), Depression (1999 after traumstic deaths if bith parents in commercial airline crash intensionally committed by levi strauss), Difficulty walking, Dysphagia, Fracture, foot (11/2019), Fracture, humerus, Frozen shoulder, Generalized osteoarthritis, GERD (gastroesophageal reflux disease), Grade I diastolic dysfunction, Headache, Headache, tension-type, Hiatal hernia, HL (hearing loss), Hypertension (07/10/2020), Insomnia, Irregular heart beat, Low back strain, Lumbosacral disc disease (Spinal stenosis),  Lumbosacral stenosis, Memory loss, Migraine, Neck strain, Neuroma of foot, Neuromuscular disorder, Numbness, Osteopenia, Osteoporosis, Paroxysmal supraventricular tachycardia (CMS-HCC), Pelvic pain, Peripheral neuropathy, PONV (postoperative nausea and vomiting), Positive ANA (antinuclear antibody), PTSD (post-traumatic stress disorder), Rotator cuff syndrome, Spinal stenosis, Tear of meniscus of knee (Complete loss of articulation cartilage bilateral), 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  PAST SURGICAL HISTORY:  has a past surgical history that includes Tonsillectomy; Hysterectomy (1996); Cholecystectomy; Carotid endarterectomy; Colonoscopy; Tubal ligation; and Esophageal dilation (09/16/2020).    SOCIAL HISTORY:  reports that she quit smoking about 16 years ago. Her smoking use included cigarettes. She has a 2 pack-year smoking history. She has quit using smokeless tobacco. She reports current alcohol use. She reports that she does not use drugs.  FAMILY HISTORY: family history includes Breast cancer (age of onset: 36) in her mother's sister; Broken bones in her brother; Heart attack in her brother and paternal grandfather; Heart disease in her father's brother, maternal grandfather, mother's brother, and paternal grandfather; Hypothyroidism in her son; Neuropathy in her brother; No Known Problems in her daughter, father, maternal grandmother, mother, and paternal grandmother.  Medications:         Current Outpatient Medications:     apixaban  (Eliquis ) 5 mg tablet, Take 5 mg by mouth 2 (two) times a day., Disp: , Rfl:     dilTIAZem CD (Cardizem CD) 120 mg 24 hr capsule, Take 1 capsule (120 mg total) by mouth 1 (one) time  each day., Disp: 90 capsule, Rfl: 3    docusate sodium (Colace) 100 mg capsule, Take 100 mg by mouth daily., Disp: , Rfl:     LORazepam  (Ativan )  0.5 mg tablet, Take 0.5 mg by mouth every 6 (six) hours., Disp: , Rfl:     blood-glucose sensor (FreeStyle Libre 3 Plus Sensor) device, 1 each every 14 (fourteen) days., Disp: 6 each, Rfl: 3    Current Facility-Administered Medications:     cyanocobalamin (Vitamin B-12) injection 1,000 mcg, 1,000 mcg, intramuscular, q30 days, Laquetta Richad Roll, MD, 1,000 mcg at 05/17/23 1059     Review of Systems:       Review of Systems   All other systems reviewed and are negative.    Objective:       Vitals:    05/11/24 1726   BP: 124/75   BP Location: Left upper arm   Patient Position: Sitting   BP Cuff Size: Adult   Pulse: (!) 106   Temp: 97 F   TempSrc: Temporal   SpO2: 98%   Weight: 164 lb   Height: 5' 7.5      Physical Exam  Constitutional:       Appearance: Normal appearance.   HENT:      Head: Normocephalic and atraumatic.      Nose: Nose normal. No rhinorrhea.      Mouth/Throat:      Mouth: Mucous membranes are moist.   Eyes:      General: No scleral icterus.     Conjunctiva/sclera: Conjunctivae normal.   Pulmonary:      Breath sounds: No stridor. No wheezing.   Abdominal:      General: There is no distension.   Musculoskeletal:         General: No swelling or deformity.   Skin:     Coloration: Skin is not jaundiced or pale.      Findings: No rash.   Neurological:      Mental Status: She is alert and oriented to person, place, and time.   Psychiatric:         Mood and Affect: Mood normal.         Behavior: Behavior normal.          Assessment/Plan:     DATA REVIEWED:   Prior records , ER notes, consultants note,   Problem List Items Addressed This Visit          Active Problems    Essential hypertension (Chronic)    Hypertension is improving with treatment.  Continue current treatment regimen.  Dietary sodium restriction.  Regular aerobic exercise.  Continue current medications.  Blood pressure will be reassessed at the next regular appointment.         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 SH still has runs of atrial fibrillation. She has had a follow up with EP and cardiology. She is on diltiazem fro rate control and on anticoagulation with Apixiban.          Hypoglycemia - Primary    Relevant Medications    blood-glucose sensor (FreeStyle Libre 3 Plus Sensor) device    Other Relevant Orders    Ambulatory referral to Endocrinology      Risks and benefits of medical care (including but not limited to vaccinations, medications, blood tests, imaging, offsite referrals, etc.) discussed in detail with the patient. All questions and concerns were  satisfactorily addressed as per the patient. The patient acknowledges and understands the treatment plan discussed during today's visit and if the patient has any adverse outcome or worsening condition, the patient will follow up immediately in the clinic or at an ER/Urgent care facility.    Return in about 3 months (around 08/09/2024).       Signed by: Laquetta Richad Roll, MD    *This note was generated by the Epic EMR system/ M Modal, the computer voice recognition software and may contain inherent errors or omissions not intended by the user. Grammatical errors, random word insertions, deletions, pronoun errors and incomplete sentences are occasional consequences of this technology due to software limitations. Not all errors are caught or corrected. If there  are questions or concerns about the content of this note or information contained within the body of this dictation they should be addressed directly with the author for clarification.

## 2024-05-12 NOTE — Telephone Encounter (Signed)
 Left message for patient to call back

## 2024-05-18 DIAGNOSIS — Z8679 Personal history of other diseases of the circulatory system: Secondary | ICD-10-CM

## 2024-05-18 DIAGNOSIS — I4891 Unspecified atrial fibrillation: Secondary | ICD-10-CM

## 2024-05-18 DIAGNOSIS — Z9889 Other specified postprocedural states: Secondary | ICD-10-CM

## 2024-05-18 LAB — MOBILE CARDIAC TELEMETRY
# of A-fib episodes: 0
# of PACs: 196160
# of PVC beats in couplets: 28
# of PVC couplets: 14
# of PVCs: 146
# of SVT episodes: 14585
# of VT episodes: 1
# of pauses: 0
% Noise burden: 4.66 %
% PACs (burden): 14.95 %
% PVCs (burden): 0.01 %
Average HR: 82 {beats}/min
Longest R-R interval (pause): 0 s
Longest SVT Episode: 39.08 s
Longest VT Episode: 2.1 s
Max HR: 124 {beats}/min
Min HR: 50 {beats}/min
Time in A-fib (burden): 0 %

## 2024-05-23 ENCOUNTER — Encounter: Payer: Self-pay | Admitting: Neurology

## 2024-05-25 NOTE — Progress Notes (Unsigned)
 GENETIC COUNSELING CONSULTATION    Patient Name: Kenneshia Rehm Oloughlin  DOB: 28-Jul-1955  Date of Visit: 05/28/2023     Patient and/or parent/legal guardian acknowledge that they received, signed, and returned [facility]'s Telemedicine consent form for today's visit encounter.    We had the pleasure of seeing Attallah Ontko Philbin today in Genetics clinic, accompanied by her husband. She is a 69 y.o. female, who presents for an initial genetic evaluation for neuropathy (presumed CIDP), imbalance, and hyperreflexia. The patient is referred by her neurologist, Dr. Kelton.  I saw the patient in a joint visit with Dr. Laneta Kay.    Family History   A three-generation family history was elicited and the pedigree is available in the media tab in the medical chart.        The family history was otherwise unremarkable for individuals with developmental delay, intellectual disability, autism, neurologic disorders, recurrent miscarriage, stillbirth, neonatal death, or congenital malformations. Parental consanguinity was denied.     Genetic Testing Discussion     During our visit today we discussed proceeding with genetic testing via Genome Sequencing (GS).     During the session we reviewed:     1) Purpose of GS: GS analyzes the protein-coding segments (exons) and the non-coding segments (introns) of all >20,000 genes in the human genome. GS can be used to identify the underlying molecular basis of a genetic disorder in an affected individual.     2) Benefits of GS: A positive result can provide helpful information for the entire family. A diagnosis can provide prognostic information, allow for tailored health surveillance and prevention, and in some cases may suggest specific treatments. In addition, a genetic answer can allow for connections to gene-specific support groups and identification of risks to other family members. A diagnostic result can provide information to allow families to make informed reproductive choices,  including the option of prenatal or preimplantation genetic testing.     3) Limitations of GS: There are limitations with genome sequencing technology; the test cannot identify all types of genetic variants. Some types of genetic variation remain difficult to detect, i.e. exon-level deletions and duplications and certain repeat expansions or contractions. Additionally, there are limitations to our knowledge of gene functions and associations with disease. Results from GS may be negative, unclear, or indicate the need for further testing in other family members. If only the proband is submitted for analysis, the sensitivity of the test is reduced as compared to having other family members included in the analysis. Due to these limitations, a negative result on GS does not rule out a genetic etiology for the medical concerns present in the patient.     4) Possible results from GS include positive, negative, variant of uncertain significance, and secondary findings. In addition, GS has the potential to reveal incidental findings, including unexpected familial relationships.        5) Regarding ACMG secondary findings, we discussed that this refers to the option for the laboratory report to include genetic variants in specific genes unrelated to the primary indication for undergoing testing. It is recommended by the [employer] of Medical Genetics and Genomics [employer]) that all patients undergoing GS be given the option to have secondary findings reported from ~80 genes associated with medically actionable conditions, including hereditary cancer syndromes and cardiac conditions.  List of ACMG secondary findings (ACMG SF v3.2): Personalizedtones.at.pdf  Information about the Genetic Information Nondiscrimination Act (GINA) and other relevant laws can be found here:  Biotechnologyanalyst.no  Brinly opted in for ACMG  secondary findings to be reported from GS.     After reviewing this information, Brittiany was interested in genetic testing and decided to proceed.     Plan/Recommendations     Yazmeen was interested in proceeding with genetic testing and verbal consent was obtained.  Genetic test: [employer] [facility] [employer]. Sample: Buccal swab kit will be sent to the home for sample collection; instructions were reviewed.   Results are expected to take 4-8 weeks, we will contact the patient by phone to review results and they will be made available in MyChart.      Schuyler Nageotte, MS, [facility]  Genetic Counselor  [facility]  [phone]  [email or web address]      A total of 56 minutes was spent on the date of service, including chart review, face-to-face counseling, coordination of testing, and documentation.

## 2024-05-25 NOTE — Progress Notes (Signed)
 Patient was called, she states that she has chest discomfort pressure like a brick is on her chest and this radiates to her back  this discomfort wakes her up from sleep. , it seems to abate with activity. She went to ER on 05/09/25 with CP and dizziness, this is accompanied with a  burning sensation. Patient took PPI for 3 weeks post ablation 03/25/24  , and was sent home. She is on Eliquis . Her CBC in ER was wnl. She also complains of LH and LE edema.  Her follow up 06/24/24 with Dr. Diona, .   Any recommendation ?

## 2024-05-26 ENCOUNTER — Telehealth (INDEPENDENT_AMBULATORY_CARE_PROVIDER_SITE_OTHER): Payer: Self-pay | Admitting: Cardiovascular Disease

## 2024-05-26 ENCOUNTER — Encounter (INDEPENDENT_AMBULATORY_CARE_PROVIDER_SITE_OTHER): Payer: Self-pay

## 2024-05-26 NOTE — Telephone Encounter (Signed)
 Copied from CRM #[id number]. Topic: Clinical Support - Speak With Nurse  >> May 26, 2024 12:50 PM Alden GAILS wrote:  Hazen, Astryd MILLER called about Clinical Support - Speak With Nurse.  Additional details:Pt is getting cardiology records faxed from [facility] cardiology.

## 2024-05-27 ENCOUNTER — Telehealth (INDEPENDENT_AMBULATORY_CARE_PROVIDER_SITE_OTHER): Payer: Self-pay

## 2024-05-27 ENCOUNTER — Telehealth (INDEPENDENT_AMBULATORY_CARE_PROVIDER_SITE_OTHER): Admitting: Clinical Genetics (M.D.)

## 2024-05-27 DIAGNOSIS — I4891 Unspecified atrial fibrillation: Secondary | ICD-10-CM

## 2024-05-27 DIAGNOSIS — G6181 Chronic inflammatory demyelinating polyneuritis: Secondary | ICD-10-CM

## 2024-05-27 DIAGNOSIS — R2689 Other abnormalities of gait and mobility: Secondary | ICD-10-CM

## 2024-05-27 NOTE — Patient Instructions (Signed)
 Genetic Testing  During our visit today, you decided to proceed with a genetic test called [employer] [facility] [employer].     Genome Sequencing is a financial risk analyst in which a laboratory looks very carefully at a person's genes, or genetic information, for changes that may have caused a genetic disorder. Think of genes like sentences in the instruction manual for our bodies. This test looks at the letters in the sentences all of our genes make to look for spelling errors or mistakes.We discussed the possible results of this test including: positive, negative, and variant of uncertain significance. This test may also reveal unexpected or unrelated information such as chromosome changes associated with unrelated health problems or non-paternity. Exome Sequencing has limitations and normal/negative results do not rule out all possible genetic condition. Additional website: cuisinecop.com.ee    Secondary Findings: This test can sometimes detect a change in a person's DNA that is unrelated to the reason that the sample was sent for testing. If this change is of medical significance, it is called an secondary finding. ACMG secondary findings are a list of conditions that the [employer] of Medical Genetics have determined are clinically actionable meaning that there are medical management recommendations for these conditions that could help manage or treat symptoms. Possible secondary findings include variants (genetic changes) that increase a person's risk for cancer or for heart disease. For more information about secondary findings please visit: plumberbiz.com.cy More information on the conditions can be here found: barmitzvahsearch.dk     Learning about Secondary Findings is voluntary and patients are allowed to decide  whether they would like to find out about these results.   You decided to OPT IN for ACMG secondary findings at the time of the visit    Information about the Genetics Information Nondiscrimination Act (GINA) can be found here: Localchronicle.no    For more general information about genetic testing and results, please visit: mapseats.co.uk     Sample Collection  The lab is sending you a sample collection kit.   Below is a link to a video about sample collection:  [email or web address]   Once you have collected the sample, make sure the tubes are properly labeled with your name, date of birth, and the date of sample collection.  Please contact me if you do not receive a kit within a week of our appointment.    Billing and Insurance  The lab accepts Medicare and you should not receive a bill for testing.    Turn Around Time  Testing will take ~ 4-8 weeks. We will call you when the results are ready and review them over the phone.    Laboratory Contact Information:  [facility]: To contact the laboratory company about billing or receipt of sample questions please call [phone] or email: [email or web address]

## 2024-05-27 NOTE — Telephone Encounter (Signed)
 7:57AM MA called patient to phone number on file to go over questions before their video visit today with Dr. Julieann and Schuyler Nageotte, [facility], at 9:30AM. No answer. LVM with call back number [phone]. Sent Direct Links

## 2024-05-27 NOTE — Progress Notes (Signed)
 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 She experiences tingling, pins and needles, numbness which has increased to sharp pains, she has this uncomfortable sensation under her skin all over. She took 3 doses of IVIG then had to stop due to an adverse reaction.      We offered a [employer]. She was interested so we will order the test.    Recommendations:   [employer] [facility]     We will call family/patient with results of testing,follow up will be based on testing results.      History:   Audrey Gilmore is a 69 y.o. 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    For further history details, see the Past Medical History, Past Surgical History, and Assessment sections.       Past Medical History:     Symptoms of tingling in feet, up to thighs, immediately after the flu shot. The weakness became chronic. She was at [facility], [facility],   The tingling has grown worse to pins and needles, stabbing pain, hands and feet, it affects her ability to walk. 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 After IVIG she had itching under her skin after that all over.    Past Surgical History:   Cardiac ablation    Family History/ Social history:   The pedigree was reviewed.      Brother has passed away, he was put on Repatha, and had a Guillain Barre reaction to it. He passed in a car accident.  Medications:       Current Medications[1]     Review of Systems:   Neuropathy,   Tinnitis  Spine issues  Atrial fibrillation/flutter  HTN      All other systems were reviewed and are  negative except as previously noted in the HPI.    Physical Exam:   There were no vitals taken for this visit.    Patient present yes      Labs Reviewed:            Rads:   Radiological Procedure reviewed.     This patient was seen with Olivia Wilmarth, Audrey Gilmore, a licensed dentist.    This case was discussed with [place].      Full consultation, including discussions with these individuals, examination, medical record review, and literature-based analysis, face-to-face time and coordination of care required  63 minutes, total time, excluding other billable time.    Signed by: Laneta Kay, MD  Chief, Division of Medical [facility]                 [1]   Current Outpatient Medications   Medication Sig Dispense Refill    acetaminophen  (TYLENOL ) 650 MG CR tablet Take 1 tablet (650 mg) by mouth every 8 (eight) hours as needed for Pain      apixaban  (ELIQUIS ) 5 MG tablet Take 1 tablet (5 mg) by mouth every 12 (twelve) hours 180 tablet  3    Cholecalciferol 50 MCG (2000 UT) Tab Take 2,000 Units by mouth (Patient not taking: Reported on 04/20/2024)      Cyanocobalamin 1000 MCG/ML Kit Inject as directed      dilTIAZem (CARDIZEM CD) 120 MG 24 hr capsule Take 1 capsule (120 mg) by mouth      hydrALAZINE  (APRESOLINE ) 25 MG tablet Take 1 tablet (25 mg) by mouth 2 (two) times daily 60 tablet 3    LORazepam  (ATIVAN ) 0.5 MG tablet Take 1 tablet (0.5 mg) by mouth daily (Patient taking differently: Take 1 tablet (0.5 mg) by mouth 4 (four) times daily)      traMADol  (ULTRAM ) 50 MG tablet TAKE 2 TABLETS BY MOUTH EVERY 8 HOURS   Takes BID (Patient taking differently: Take 2 tablets (100 mg) by mouth Takes bid to TID)  1     No current facility-administered medications for this visit.

## 2024-05-28 ENCOUNTER — Ambulatory Visit (INDEPENDENT_AMBULATORY_CARE_PROVIDER_SITE_OTHER): Admitting: Cardiovascular Disease

## 2024-05-28 ENCOUNTER — Encounter (INDEPENDENT_AMBULATORY_CARE_PROVIDER_SITE_OTHER): Payer: Self-pay | Admitting: Cardiovascular Disease

## 2024-05-28 VITALS — BP 120/77 | HR 86 | Wt 166.0 lb

## 2024-05-28 DIAGNOSIS — I10 Essential (primary) hypertension: Secondary | ICD-10-CM

## 2024-05-28 NOTE — Progress Notes (Signed)
 [facility] OFFICE VISIT    I had the pleasure of seeing [name] today for cardiovascular follow up. She is a pleasant 69 y.o. female with a history of labile hypertension and paroxysmal atrial fibrillation status post ablation in November 2025 who presents today at the request of my colleague Dr. Diona to discuss potential transcatheter based treatment strategies for her elevated blood pressure.  The patient states that since she underwent the ablation, she has been stable.  However she does experience episodes of dizziness and lightheadedness.  Today she is only on diltiazem 120 mg daily as she did not tolerate hydralazine .  Of note she has tried a number of antihypertensive agents and rate control agents to include beta-blockers and she has not tolerated them.  She has not been on other antihypertensive agents to date.  Echocardiogram in December demonstrated preserved biventricular dysfunction with grade 2 diastolic dysfunction and no evidence of valve disease.    MEDICATIONS:     acetaminophen  (TYLENOL ) 650 MG CR tablet, Take 1 tablet (650 mg) by mouth every 8 (eight) hours as needed for Pain (Patient taking differently: Take 500 mg by mouth every 8 (eight) hours as needed for Pain)    apixaban  (ELIQUIS ) 5 MG tablet, Take 1 tablet (5 mg) by mouth every 12 (twelve) hours    Cholecalciferol 50 MCG (2000 UT) Tab, Take 2,000 Units by mouth    Cyanocobalamin 1000 MCG/ML Kit, Inject as directed    dilTIAZem (CARDIZEM CD) 120 MG 24 hr capsule, Take 1 capsule (120 mg) by mouth    LORazepam  (ATIVAN ) 0.5 MG tablet, Take 1 tablet (0.5 mg) by mouth daily (Patient taking differently: Take 1 tablet (0.5 mg) by mouth 4 (four) times daily)    traMADol  (ULTRAM ) 50 MG tablet, TAKE 2 TABLETS BY MOUTH EVERY 8 HOURS   Takes BID (Patient taking differently: Take 1 tablet (50 mg) by mouth every 8 (eight) hours as needed Takes bid to TID)    hydrALAZINE  (APRESOLINE ) 25 MG tablet, Take 1 tablet (25 mg) by mouth 2 (two)  times daily (Patient not taking: Reported on 05/28/2024)    PHYSICAL EXAMINATION  Vital Signs: BP 120/77 (BP Site: Left arm, Patient Position: Sitting, Cuff Size: Large)   Pulse 86   Wt 75.3 kg (166 lb)   SpO2 97%   BMI 25.62 kg/m    Chest: Clear to auscultation bilaterally  Cardiovascular: No murmurs or gallops.   Abdomen: Soft, nontender. No pulsatile masses or bruits.    Extremities: Warm without edema. Peripheral pulses are full and equal.    LABS:   Lab Results   Component Value Date    WBC 6.33 05/09/2024    HGB 13.0 05/09/2024    HCT 38.2 05/09/2024    PLT 282 05/09/2024    NA 137 05/09/2024    K 4.0 05/09/2024    BUN 17 05/09/2024    CREAT 0.8 05/09/2024    GLU 94 05/09/2024    AST 28 05/09/2024    ALT 18 05/09/2024        IMPRESSION/RECOMMENDATIONS: Audrey Gilmore is a 69 y.o. female with a history of labile hypertension and paroxysmal atrial fibrillation status post ablation in November 2025 who presents today at the request of my colleague Dr. Diona to discuss potential transcatheter based treatment strategies for her elevated blood pressure.       I had an extensive discussion with the patient regarding her overall cardiovascular clinical profile, in particular with regards to her hypertension.  Mention, she does have labile blood pressures but there does not appear to be a component of dysautonomia.  Based on these considerations, she does have uncontrolled hypertension, but she does not have FDA criteria for resistant hypertension as she is not on 3+ antihypertensive agents with the blood pressure greater than 140/90.  I reviewed her chart in detail and there is not appear to be any secondary cause of hypertension.  This is particularly important given consideration for potential transcatheter based renal arterial denervation.  However as we discussed, I would like to proceed with further workup to rule out secondary causes of hypertension to better understand whether she may benefit from catheter-based  treatment strategies.  To that end and after participating in shared decision making, advised the following:    1.  Completion of secondary cause of hypertension workup to include renal arterial duplex, serum renin/aldosterone, cortisol and 24-hour urinary metanephrines.  2.  For now she will continue on the diltiazem as it provides both blood pressure and rate control in the setting of her atrial fibrillation.  3.  Early office follow-up with me again in 1 month to discuss the findings of her testing and for follow-up discussions accordingly.    As always, many thanks to Dr. Diona for the opportunity to participate in the care and very nice patient.

## 2024-06-09 ENCOUNTER — Ambulatory Visit (INDEPENDENT_AMBULATORY_CARE_PROVIDER_SITE_OTHER): Admitting: Cardiovascular Disease

## 2024-06-10 ENCOUNTER — Other Ambulatory Visit (INDEPENDENT_AMBULATORY_CARE_PROVIDER_SITE_OTHER): Payer: Self-pay

## 2024-06-16 ENCOUNTER — Ambulatory Visit (INDEPENDENT_AMBULATORY_CARE_PROVIDER_SITE_OTHER): Admitting: Cardiovascular Disease

## 2024-06-16 ENCOUNTER — Encounter (INDEPENDENT_AMBULATORY_CARE_PROVIDER_SITE_OTHER): Payer: Self-pay | Admitting: Cardiovascular Disease

## 2024-06-16 VITALS — BP 149/77 | HR 84 | Ht 67.5 in | Wt 162.0 lb

## 2024-06-16 DIAGNOSIS — R601 Generalized edema: Secondary | ICD-10-CM

## 2024-06-16 DIAGNOSIS — I480 Paroxysmal atrial fibrillation: Secondary | ICD-10-CM

## 2024-06-16 DIAGNOSIS — I10 Essential (primary) hypertension: Secondary | ICD-10-CM

## 2024-06-16 LAB — ECG 12-LEAD
Atrial Rate: 103 {beats}/min
P Axis: 66 degrees
P-R Interval: 144 ms
Q-T Interval: 334 ms
QRS Duration: 90 ms
QTC Calculation (Bezet): 437 ms
R Axis: 72 degrees
T Axis: 49 degrees
Ventricular Rate: 103 {beats}/min

## 2024-06-16 MED ORDER — HYDROCHLOROTHIAZIDE 25 MG PO TABS: 12.5000 mg | ORAL_TABLET | Freq: Every day | ORAL | 3 refills | Status: AC

## 2024-06-24 ENCOUNTER — Ambulatory Visit (INDEPENDENT_AMBULATORY_CARE_PROVIDER_SITE_OTHER): Admitting: Internal Medicine

## 2024-07-17 ENCOUNTER — Ambulatory Visit (HOSPITAL_BASED_OUTPATIENT_CLINIC_OR_DEPARTMENT_OTHER): Admitting: Internal Medicine

## 2024-08-21 ENCOUNTER — Ambulatory Visit (INDEPENDENT_AMBULATORY_CARE_PROVIDER_SITE_OTHER): Admitting: Specialist

## 2024-10-01 ENCOUNTER — Ambulatory Visit (INDEPENDENT_AMBULATORY_CARE_PROVIDER_SITE_OTHER): Admitting: Specialist
